# Patient Record
Sex: Female | Born: 1940 | Race: White | Hispanic: No | Marital: Married | Smoking: Former smoker
Health system: Southern US, Community
[De-identification: ages and names within clinical notes are randomized; demographics above are authoritative.]

## PROBLEM LIST (undated history)

## (undated) DIAGNOSIS — I639 Cerebral infarction, unspecified: Secondary | ICD-10-CM

## (undated) DIAGNOSIS — J449 Chronic obstructive pulmonary disease, unspecified: Secondary | ICD-10-CM

## (undated) DIAGNOSIS — D649 Anemia, unspecified: Secondary | ICD-10-CM

## (undated) DIAGNOSIS — J9610 Chronic respiratory failure, unspecified whether with hypoxia or hypercapnia: Secondary | ICD-10-CM

## (undated) DIAGNOSIS — I4891 Unspecified atrial fibrillation: Secondary | ICD-10-CM

## (undated) DIAGNOSIS — K922 Gastrointestinal hemorrhage, unspecified: Secondary | ICD-10-CM

## (undated) HISTORY — DX: Gastrointestinal hemorrhage, unspecified: K92.2

## (undated) HISTORY — DX: Unspecified atrial fibrillation: I48.91

## (undated) HISTORY — DX: Chronic respiratory failure, unspecified whether with hypoxia or hypercapnia: J96.10

## (undated) HISTORY — DX: Chronic obstructive pulmonary disease, unspecified: J44.9

## (undated) HISTORY — DX: Cerebral infarction, unspecified: I63.9

## (undated) HISTORY — DX: Anemia, unspecified: D64.9

---

## 2004-02-18 ENCOUNTER — Ambulatory Visit: Payer: Self-pay

## 2004-10-03 ENCOUNTER — Ambulatory Visit: Payer: Self-pay | Admitting: Internal Medicine

## 2005-02-07 ENCOUNTER — Emergency Department: Payer: Self-pay | Admitting: Emergency Medicine

## 2005-10-05 ENCOUNTER — Ambulatory Visit: Payer: Self-pay | Admitting: Internal Medicine

## 2006-03-07 ENCOUNTER — Ambulatory Visit: Payer: Self-pay | Admitting: Specialist

## 2006-05-10 ENCOUNTER — Ambulatory Visit: Payer: Self-pay | Admitting: Internal Medicine

## 2006-05-20 ENCOUNTER — Other Ambulatory Visit: Payer: Self-pay

## 2006-05-20 ENCOUNTER — Ambulatory Visit: Payer: Self-pay | Admitting: General Practice

## 2006-05-27 ENCOUNTER — Ambulatory Visit: Payer: Self-pay | Admitting: General Surgery

## 2006-12-19 ENCOUNTER — Ambulatory Visit: Payer: Self-pay | Admitting: Internal Medicine

## 2006-12-27 ENCOUNTER — Ambulatory Visit: Payer: Self-pay | Admitting: Gastroenterology

## 2007-01-31 ENCOUNTER — Ambulatory Visit: Payer: Self-pay | Admitting: Gastroenterology

## 2007-03-28 ENCOUNTER — Ambulatory Visit: Payer: Self-pay | Admitting: Gastroenterology

## 2007-06-12 ENCOUNTER — Ambulatory Visit: Payer: Self-pay | Admitting: Cardiology

## 2007-06-19 ENCOUNTER — Ambulatory Visit: Payer: Self-pay | Admitting: Gastroenterology

## 2007-07-29 ENCOUNTER — Encounter: Payer: Self-pay | Admitting: Specialist

## 2007-07-30 ENCOUNTER — Encounter: Payer: Self-pay | Admitting: Specialist

## 2007-08-29 ENCOUNTER — Encounter: Payer: Self-pay | Admitting: Specialist

## 2007-09-29 ENCOUNTER — Encounter: Payer: Self-pay | Admitting: Specialist

## 2007-10-29 ENCOUNTER — Encounter: Payer: Self-pay | Admitting: Specialist

## 2007-12-08 ENCOUNTER — Ambulatory Visit: Payer: Self-pay | Admitting: Internal Medicine

## 2008-12-08 ENCOUNTER — Ambulatory Visit: Payer: Self-pay | Admitting: Internal Medicine

## 2009-01-20 ENCOUNTER — Ambulatory Visit: Payer: Self-pay | Admitting: Gastroenterology

## 2009-01-24 ENCOUNTER — Inpatient Hospital Stay: Payer: Self-pay | Admitting: Cardiology

## 2009-07-15 ENCOUNTER — Inpatient Hospital Stay: Payer: Self-pay | Admitting: Internal Medicine

## 2009-12-22 ENCOUNTER — Ambulatory Visit: Payer: Self-pay | Admitting: Internal Medicine

## 2010-04-30 HISTORY — PX: PATENT FORAMEN OVALE CLOSURE: SHX2181

## 2010-05-06 ENCOUNTER — Emergency Department: Payer: Self-pay | Admitting: Emergency Medicine

## 2011-03-21 ENCOUNTER — Ambulatory Visit: Payer: Self-pay | Admitting: Internal Medicine

## 2011-09-29 ENCOUNTER — Inpatient Hospital Stay: Payer: Self-pay | Admitting: Internal Medicine

## 2011-09-29 LAB — URINALYSIS, COMPLETE
Blood: NEGATIVE
Leukocyte Esterase: NEGATIVE
Nitrite: NEGATIVE
Ph: 8 (ref 4.5–8.0)
Protein: NEGATIVE
Specific Gravity: 1.005 (ref 1.003–1.030)
WBC UR: <1 /HPF (ref 0–5)

## 2011-09-29 LAB — PROTIME-INR
INR: 1
Prothrombin Time: 13.2 secs (ref 11.5–14.7)

## 2011-09-29 LAB — CK TOTAL AND CKMB (NOT AT ARMC)
CK, Total: 34 U/L (ref 21–215)
CK-MB: 0.7 ng/mL (ref 0.5–3.6)

## 2011-09-29 LAB — COMPREHENSIVE METABOLIC PANEL
Albumin: 3.6 g/dL (ref 3.4–5.0)
Anion Gap: 6 — ABNORMAL LOW (ref 7–16)
Bilirubin,Total: 0.7 mg/dL (ref 0.2–1.0)
Chloride: 100 mmol/L (ref 98–107)
EGFR (African American): >60
EGFR (Non-African Amer.): >60
Glucose: 103 mg/dL — ABNORMAL HIGH (ref 65–99)
Potassium: 3.3 mmol/L — ABNORMAL LOW (ref 3.5–5.1)
SGOT(AST): 37 U/L (ref 15–37)
SGPT (ALT): 22 U/L
Sodium: 142 mmol/L (ref 136–145)
Total Protein: 8 g/dL (ref 6.4–8.2)

## 2011-09-29 LAB — CBC
HCT: 36.5 % (ref 35.0–47.0)
MCH: 32.7 pg (ref 26.0–34.0)
MCV: 98 fL (ref 80–100)
Platelet: 136 10*3/uL — ABNORMAL LOW (ref 150–440)
RBC: 3.73 10*6/uL — ABNORMAL LOW (ref 3.80–5.20)
RDW: 14.2 % (ref 11.5–14.5)
WBC: 4.1 10*3/uL (ref 3.6–11.0)

## 2011-09-29 LAB — APTT: Activated PTT: 26.1 secs (ref 23.6–35.9)

## 2011-09-29 LAB — TSH: Thyroid Stimulating Horm: 2.13 u[IU]/mL

## 2011-09-29 LAB — MAGNESIUM: Magnesium: 2 mg/dL

## 2011-09-30 LAB — CBC WITH DIFFERENTIAL/PLATELET
Basophil #: 0 10*3/uL (ref 0.0–0.1)
Basophil %: 1 %
HCT: 31 % — ABNORMAL LOW (ref 35.0–47.0)
HGB: 10.6 g/dL — ABNORMAL LOW (ref 12.0–16.0)
MCH: 33.5 pg (ref 26.0–34.0)
MCHC: 34.3 g/dL (ref 32.0–36.0)
MCV: 98 fL (ref 80–100)
Monocyte #: 0.5 x10 3/mm (ref 0.2–0.9)
Neutrophil %: 61.3 %
RBC: 3.17 10*6/uL — ABNORMAL LOW (ref 3.80–5.20)
RDW: 14.2 % (ref 11.5–14.5)

## 2011-09-30 LAB — CK TOTAL AND CKMB (NOT AT ARMC)
CK, Total: 27 U/L (ref 21–215)
CK-MB: 1 ng/mL (ref 0.5–3.6)

## 2011-09-30 LAB — LIPID PANEL
HDL Cholesterol: 50 mg/dL (ref 40–60)
Ldl Cholesterol, Calc: 44 mg/dL (ref 0–100)

## 2012-03-24 ENCOUNTER — Ambulatory Visit: Payer: Self-pay

## 2013-06-22 ENCOUNTER — Inpatient Hospital Stay: Payer: Self-pay

## 2013-06-22 LAB — URINALYSIS, COMPLETE
BILIRUBIN, UR: NEGATIVE
BLOOD: NEGATIVE
Bacteria: NONE SEEN
GLUCOSE, UR: NEGATIVE mg/dL (ref 0–75)
Ketone: NEGATIVE
LEUKOCYTE ESTERASE: NEGATIVE
Nitrite: NEGATIVE
PH: 8 (ref 4.5–8.0)
PROTEIN: NEGATIVE
SPECIFIC GRAVITY: 1.006 (ref 1.003–1.030)
Squamous Epithelial: 1
WBC UR: 1 /HPF (ref 0–5)

## 2013-06-22 LAB — TROPONIN I: Troponin-I: <0.02 ng/mL

## 2013-06-22 LAB — CBC
HCT: 35.7 % (ref 35.0–47.0)
HGB: 12 g/dL (ref 12.0–16.0)
MCH: 31.3 pg (ref 26.0–34.0)
MCHC: 33.7 g/dL (ref 32.0–36.0)
MCV: 93 fL (ref 80–100)
Platelet: 125 10*3/uL — ABNORMAL LOW (ref 150–440)
RBC: 3.84 10*6/uL (ref 3.80–5.20)
RDW: 13.8 % (ref 11.5–14.5)
WBC: 4.5 10*3/uL (ref 3.6–11.0)

## 2013-06-22 LAB — COMPREHENSIVE METABOLIC PANEL
ALK PHOS: 117 U/L
AST: 33 U/L (ref 15–37)
Albumin: 3.4 g/dL (ref 3.4–5.0)
Anion Gap: 1 — ABNORMAL LOW (ref 7–16)
BUN: 13 mg/dL (ref 7–18)
Bilirubin,Total: 0.5 mg/dL (ref 0.2–1.0)
Calcium, Total: 9 mg/dL (ref 8.5–10.1)
Chloride: 99 mmol/L (ref 98–107)
Co2: 38 mmol/L — ABNORMAL HIGH (ref 21–32)
Creatinine: 0.91 mg/dL (ref 0.60–1.30)
EGFR (African American): >60
EGFR (Non-African Amer.): >60
GLUCOSE: 93 mg/dL (ref 65–99)
OSMOLALITY: 275 (ref 275–301)
Potassium: 3.4 mmol/L — ABNORMAL LOW (ref 3.5–5.1)
SGPT (ALT): 21 U/L (ref 12–78)
SODIUM: 138 mmol/L (ref 136–145)
Total Protein: 7.5 g/dL (ref 6.4–8.2)

## 2013-06-23 LAB — BASIC METABOLIC PANEL
ANION GAP: 5 — AB (ref 7–16)
BUN: 15 mg/dL (ref 7–18)
CREATININE: 0.83 mg/dL (ref 0.60–1.30)
Calcium, Total: 8.6 mg/dL (ref 8.5–10.1)
Chloride: 103 mmol/L (ref 98–107)
Co2: 37 mmol/L — ABNORMAL HIGH (ref 21–32)
EGFR (Non-African Amer.): >60
GLUCOSE: 98 mg/dL (ref 65–99)
Osmolality: 290 (ref 275–301)
Potassium: 3.5 mmol/L (ref 3.5–5.1)
SODIUM: 145 mmol/L (ref 136–145)

## 2013-06-23 LAB — CBC WITH DIFFERENTIAL/PLATELET
BASOS ABS: 0 10*3/uL (ref 0.0–0.1)
BASOS PCT: 0.8 %
BASOS PCT: 1.1 %
Basophil #: 0.1 10*3/uL (ref 0.0–0.1)
EOS PCT: 5.2 %
Eosinophil #: 0.2 10*3/uL (ref 0.0–0.7)
Eosinophil #: 0.2 10*3/uL (ref 0.0–0.7)
Eosinophil %: 4.1 %
HCT: 29.7 % — ABNORMAL LOW (ref 35.0–47.0)
HCT: 35.1 % (ref 35.0–47.0)
HGB: 10.2 g/dL — ABNORMAL LOW (ref 12.0–16.0)
HGB: 11.4 g/dL — AB (ref 12.0–16.0)
LYMPHS ABS: 0.6 10*3/uL — AB (ref 1.0–3.6)
LYMPHS ABS: 0.6 10*3/uL — AB (ref 1.0–3.6)
LYMPHS PCT: 14.3 %
Lymphocyte %: 10.3 %
MCH: 31.9 pg (ref 26.0–34.0)
MCH: 30.8 pg (ref 26.0–34.0)
MCHC: 34.3 g/dL (ref 32.0–36.0)
MCHC: 32.6 g/dL (ref 32.0–36.0)
MCV: 93 fL (ref 80–100)
MCV: 95 fL (ref 80–100)
MONO ABS: 0.4 x10 3/mm (ref 0.2–0.9)
Monocyte #: 0.4 x10 3/mm (ref 0.2–0.9)
Monocyte %: 10.5 %
Monocyte %: 7.5 %
NEUTROS ABS: 2.9 10*3/uL (ref 1.4–6.5)
NEUTROS PCT: 69.2 %
NEUTROS PCT: 77 %
Neutrophil #: 4.2 10*3/uL (ref 1.4–6.5)
PLATELETS: 124 10*3/uL — AB (ref 150–440)
Platelet: 112 10*3/uL — ABNORMAL LOW (ref 150–440)
RBC: 3.19 10*6/uL — ABNORMAL LOW (ref 3.80–5.20)
RBC: 3.71 10*6/uL — ABNORMAL LOW (ref 3.80–5.20)
RDW: 14 % (ref 11.5–14.5)
RDW: 13.7 % (ref 11.5–14.5)
WBC: 4.1 10*3/uL (ref 3.6–11.0)
WBC: 5.5 10*3/uL (ref 3.6–11.0)

## 2013-06-23 LAB — MAGNESIUM: MAGNESIUM: 2 mg/dL

## 2013-06-23 LAB — PROTIME-INR
INR: 1.1
Prothrombin Time: 14.2 secs (ref 11.5–14.7)

## 2013-06-24 LAB — BASIC METABOLIC PANEL
ANION GAP: 4 — AB (ref 7–16)
BUN: 12 mg/dL (ref 7–18)
Calcium, Total: 8 mg/dL — ABNORMAL LOW (ref 8.5–10.1)
Chloride: 107 mmol/L (ref 98–107)
Co2: 31 mmol/L (ref 21–32)
Creatinine: 0.78 mg/dL (ref 0.60–1.30)
EGFR (African American): >60
Glucose: 86 mg/dL (ref 65–99)
Osmolality: 282 (ref 275–301)
POTASSIUM: 3.9 mmol/L (ref 3.5–5.1)
Sodium: 142 mmol/L (ref 136–145)

## 2013-06-24 LAB — HEMOGLOBIN: HGB: 10.4 g/dL — ABNORMAL LOW (ref 12.0–16.0)

## 2013-06-24 LAB — TSH: Thyroid Stimulating Horm: 1.69 u[IU]/mL

## 2013-06-25 LAB — BASIC METABOLIC PANEL
Anion Gap: 4 — ABNORMAL LOW (ref 7–16)
BUN: 14 mg/dL (ref 7–18)
Calcium, Total: 8.1 mg/dL — ABNORMAL LOW (ref 8.5–10.1)
Chloride: 107 mmol/L (ref 98–107)
Co2: 31 mmol/L (ref 21–32)
Creatinine: 0.81 mg/dL (ref 0.60–1.30)
EGFR (African American): >60
Glucose: 84 mg/dL (ref 65–99)
Osmolality: 283 (ref 275–301)
POTASSIUM: 4.1 mmol/L (ref 3.5–5.1)
Sodium: 142 mmol/L (ref 136–145)

## 2013-06-25 LAB — CBC WITH DIFFERENTIAL/PLATELET
Basophil #: 0 10*3/uL (ref 0.0–0.1)
Basophil %: 0.8 %
EOS ABS: 0.1 10*3/uL (ref 0.0–0.7)
EOS PCT: 3.6 %
HCT: 25.8 % — ABNORMAL LOW (ref 35.0–47.0)
HGB: 8.7 g/dL — ABNORMAL LOW (ref 12.0–16.0)
LYMPHS PCT: 14 %
Lymphocyte #: 0.5 10*3/uL — ABNORMAL LOW (ref 1.0–3.6)
MCH: 31.3 pg (ref 26.0–34.0)
MCHC: 33.8 g/dL (ref 32.0–36.0)
MCV: 93 fL (ref 80–100)
Monocyte #: 0.4 x10 3/mm (ref 0.2–0.9)
Monocyte %: 9.4 %
Neutrophil #: 2.8 10*3/uL (ref 1.4–6.5)
Neutrophil %: 72.2 %
Platelet: 99 10*3/uL — ABNORMAL LOW (ref 150–440)
RBC: 2.79 10*6/uL — ABNORMAL LOW (ref 3.80–5.20)
RDW: 13.5 % (ref 11.5–14.5)
WBC: 3.9 10*3/uL (ref 3.6–11.0)

## 2013-06-25 LAB — HEMOGLOBIN: HGB: 9.9 g/dL — AB (ref 12.0–16.0)

## 2013-06-26 LAB — CBC WITH DIFFERENTIAL/PLATELET
Basophil #: 0 10*3/uL (ref 0.0–0.1)
Basophil %: 1 %
EOS ABS: 0.2 10*3/uL (ref 0.0–0.7)
EOS PCT: 5.4 %
HCT: 24.5 % — AB (ref 35.0–47.0)
HGB: 8.3 g/dL — AB (ref 12.0–16.0)
LYMPHS ABS: 0.6 10*3/uL — AB (ref 1.0–3.6)
Lymphocyte %: 17.4 %
MCH: 31.6 pg (ref 26.0–34.0)
MCHC: 33.9 g/dL (ref 32.0–36.0)
MCV: 93 fL (ref 80–100)
Monocyte #: 0.3 x10 3/mm (ref 0.2–0.9)
Monocyte %: 10.4 %
NEUTROS ABS: 2.1 10*3/uL (ref 1.4–6.5)
NEUTROS PCT: 65.8 %
PLATELETS: 98 10*3/uL — AB (ref 150–440)
RBC: 2.63 10*6/uL — ABNORMAL LOW (ref 3.80–5.20)
RDW: 13.4 % (ref 11.5–14.5)
WBC: 3.2 10*3/uL — AB (ref 3.6–11.0)

## 2013-06-26 LAB — BASIC METABOLIC PANEL
ANION GAP: 4 — AB (ref 7–16)
BUN: 15 mg/dL (ref 7–18)
CHLORIDE: 110 mmol/L — AB (ref 98–107)
CREATININE: 0.69 mg/dL (ref 0.60–1.30)
Calcium, Total: 8.5 mg/dL (ref 8.5–10.1)
Co2: 29 mmol/L (ref 21–32)
EGFR (Non-African Amer.): >60
Glucose: 75 mg/dL (ref 65–99)
Osmolality: 285 (ref 275–301)
POTASSIUM: 4.3 mmol/L (ref 3.5–5.1)
Sodium: 143 mmol/L (ref 136–145)

## 2013-06-26 LAB — HEMOGLOBIN
HGB: 9.3 g/dL — ABNORMAL LOW (ref 12.0–16.0)
HGB: 9.8 g/dL — ABNORMAL LOW (ref 12.0–16.0)

## 2013-06-27 LAB — HEMOGLOBIN: HGB: 8.5 g/dL — ABNORMAL LOW (ref 12.0–16.0)

## 2013-06-28 LAB — HEMOGLOBIN: HGB: 9.9 g/dL — ABNORMAL LOW (ref 12.0–16.0)

## 2013-06-29 LAB — HEMOGLOBIN: HGB: 9 g/dL — ABNORMAL LOW (ref 12.0–16.0)

## 2014-04-02 ENCOUNTER — Inpatient Hospital Stay: Payer: Self-pay | Admitting: Internal Medicine

## 2014-04-02 LAB — COMPREHENSIVE METABOLIC PANEL
ALBUMIN: 2.7 g/dL — AB (ref 3.4–5.0)
Alkaline Phosphatase: 85 U/L
Anion Gap: 6 — ABNORMAL LOW (ref 7–16)
BILIRUBIN TOTAL: 0.3 mg/dL (ref 0.2–1.0)
BUN: 20 mg/dL — ABNORMAL HIGH (ref 7–18)
CREATININE: 0.98 mg/dL (ref 0.60–1.30)
Calcium, Total: 8.4 mg/dL — ABNORMAL LOW (ref 8.5–10.1)
Chloride: 107 mmol/L (ref 98–107)
Co2: 25 mmol/L (ref 21–32)
EGFR (Non-African Amer.): 59 — ABNORMAL LOW
GLUCOSE: 112 mg/dL — AB (ref 65–99)
Osmolality: 279 (ref 275–301)
Potassium: 5 mmol/L (ref 3.5–5.1)
SGOT(AST): 28 U/L (ref 15–37)
SGPT (ALT): 17 U/L
SODIUM: 138 mmol/L (ref 136–145)
TOTAL PROTEIN: 5.9 g/dL — AB (ref 6.4–8.2)

## 2014-04-02 LAB — CBC
HCT: 26.4 % — AB (ref 35.0–47.0)
HGB: 8.4 g/dL — ABNORMAL LOW (ref 12.0–16.0)
MCH: 29.2 pg (ref 26.0–34.0)
MCHC: 31.9 g/dL — ABNORMAL LOW (ref 32.0–36.0)
MCV: 92 fL (ref 80–100)
Platelet: 128 10*3/uL — ABNORMAL LOW (ref 150–440)
RBC: 2.88 10*6/uL — ABNORMAL LOW (ref 3.80–5.20)
RDW: 20.7 % — ABNORMAL HIGH (ref 11.5–14.5)
WBC: 5.9 10*3/uL (ref 3.6–11.0)

## 2014-04-03 LAB — CBC WITH DIFFERENTIAL/PLATELET
BASOS ABS: 0 10*3/uL (ref 0.0–0.1)
BASOS PCT: 1.2 %
Eosinophil #: 0.1 10*3/uL (ref 0.0–0.7)
Eosinophil %: 3.8 %
HCT: 19.8 % — AB (ref 35.0–47.0)
HGB: 6.3 g/dL — AB (ref 12.0–16.0)
Lymphocyte #: 0.6 10*3/uL — ABNORMAL LOW (ref 1.0–3.6)
Lymphocyte %: 14.9 %
MCH: 28.8 pg (ref 26.0–34.0)
MCHC: 32 g/dL (ref 32.0–36.0)
MCV: 90 fL (ref 80–100)
Monocyte #: 0.3 x10 3/mm (ref 0.2–0.9)
Monocyte %: 8.3 %
Neutrophil #: 2.7 10*3/uL (ref 1.4–6.5)
Neutrophil %: 71.8 %
PLATELETS: 94 10*3/uL — AB (ref 150–440)
RBC: 2.2 10*6/uL — ABNORMAL LOW (ref 3.80–5.20)
RDW: 20.3 % — ABNORMAL HIGH (ref 11.5–14.5)
WBC: 3.7 10*3/uL (ref 3.6–11.0)

## 2014-04-03 LAB — BASIC METABOLIC PANEL
ANION GAP: 9 (ref 7–16)
BUN: 15 mg/dL (ref 7–18)
CHLORIDE: 110 mmol/L — AB (ref 98–107)
CREATININE: 0.93 mg/dL (ref 0.60–1.30)
Calcium, Total: 7.8 mg/dL — ABNORMAL LOW (ref 8.5–10.1)
Co2: 28 mmol/L (ref 21–32)
EGFR (African American): >60
EGFR (Non-African Amer.): >60
GLUCOSE: 82 mg/dL (ref 65–99)
Osmolality: 292 (ref 275–301)
POTASSIUM: 4.6 mmol/L (ref 3.5–5.1)
Sodium: 147 mmol/L — ABNORMAL HIGH (ref 136–145)

## 2014-04-03 LAB — HEMOGLOBIN: HGB: 8.2 g/dL — ABNORMAL LOW (ref 12.0–16.0)

## 2014-04-04 LAB — CBC WITH DIFFERENTIAL/PLATELET
BASOS PCT: 0.9 %
Basophil #: 0 10*3/uL (ref 0.0–0.1)
Basophil #: 0 10*3/uL (ref 0.0–0.1)
Basophil %: 0.8 %
EOS PCT: 4.5 %
Eosinophil #: 0.2 10*3/uL (ref 0.0–0.7)
Eosinophil #: 0.2 10*3/uL (ref 0.0–0.7)
Eosinophil %: 4.9 %
HCT: 22.2 % — AB (ref 35.0–47.0)
HCT: 22.1 % — AB (ref 35.0–47.0)
HGB: 7.2 g/dL — ABNORMAL LOW (ref 12.0–16.0)
HGB: 7.1 g/dL — AB (ref 12.0–16.0)
Lymphocyte #: 0.5 10*3/uL — ABNORMAL LOW (ref 1.0–3.6)
Lymphocyte #: 0.5 10*3/uL — ABNORMAL LOW (ref 1.0–3.6)
Lymphocyte %: 14.5 %
Lymphocyte %: 14 %
MCH: 29.5 pg (ref 26.0–34.0)
MCH: 29.2 pg (ref 26.0–34.0)
MCHC: 32.5 g/dL (ref 32.0–36.0)
MCHC: 32.1 g/dL (ref 32.0–36.0)
MCV: 91 fL (ref 80–100)
MCV: 91 fL (ref 80–100)
MONOS PCT: 10 %
Monocyte #: 0.3 x10 3/mm (ref 0.2–0.9)
Monocyte #: 0.3 x10 3/mm (ref 0.2–0.9)
Monocyte %: 9.6 %
NEUTROS PCT: 70.1 %
NEUTROS PCT: 70.7 %
Neutrophil #: 2.5 10*3/uL (ref 1.4–6.5)
Neutrophil #: 2.4 10*3/uL (ref 1.4–6.5)
PLATELETS: 87 10*3/uL — AB (ref 150–440)
PLATELETS: 86 10*3/uL — AB (ref 150–440)
RBC: 2.45 10*6/uL — ABNORMAL LOW (ref 3.80–5.20)
RBC: 2.43 10*6/uL — AB (ref 3.80–5.20)
RDW: 18.8 % — ABNORMAL HIGH (ref 11.5–14.5)
RDW: 18.5 % — ABNORMAL HIGH (ref 11.5–14.5)
WBC: 3.5 10*3/uL — ABNORMAL LOW (ref 3.6–11.0)
WBC: 3.5 10*3/uL — ABNORMAL LOW (ref 3.6–11.0)

## 2014-04-04 LAB — BASIC METABOLIC PANEL
ANION GAP: 4 — AB (ref 7–16)
BUN: 9 mg/dL (ref 7–18)
CO2: 33 mmol/L — AB (ref 21–32)
Calcium, Total: 7.5 mg/dL — ABNORMAL LOW (ref 8.5–10.1)
Chloride: 107 mmol/L (ref 98–107)
Creatinine: 0.86 mg/dL (ref 0.60–1.30)
EGFR (African American): >60
EGFR (Non-African Amer.): >60
Glucose: 80 mg/dL (ref 65–99)
OSMOLALITY: 284 (ref 275–301)
Potassium: 3.4 mmol/L — ABNORMAL LOW (ref 3.5–5.1)
Sodium: 144 mmol/L (ref 136–145)

## 2014-04-04 LAB — HEMOGLOBIN: HGB: 9.5 g/dL — ABNORMAL LOW (ref 12.0–16.0)

## 2014-04-05 LAB — CBC WITH DIFFERENTIAL/PLATELET
BASOS ABS: 0 10*3/uL (ref 0.0–0.1)
Basophil %: 0.7 %
EOS PCT: 4.3 %
Eosinophil #: 0.2 10*3/uL (ref 0.0–0.7)
HCT: 26.2 % — ABNORMAL LOW (ref 35.0–47.0)
HGB: 8.6 g/dL — ABNORMAL LOW (ref 12.0–16.0)
LYMPHS ABS: 0.5 10*3/uL — AB (ref 1.0–3.6)
Lymphocyte %: 12.9 %
MCH: 29.8 pg (ref 26.0–34.0)
MCHC: 32.8 g/dL (ref 32.0–36.0)
MCV: 91 fL (ref 80–100)
MONO ABS: 0.4 x10 3/mm (ref 0.2–0.9)
MONOS PCT: 8.5 %
NEUTROS PCT: 73.6 %
Neutrophil #: 3.1 10*3/uL (ref 1.4–6.5)
Platelet: 92 10*3/uL — ABNORMAL LOW (ref 150–440)
RBC: 2.88 10*6/uL — ABNORMAL LOW (ref 3.80–5.20)
RDW: 18.3 % — AB (ref 11.5–14.5)
WBC: 4.2 10*3/uL (ref 3.6–11.0)

## 2014-04-05 LAB — HEMOGLOBIN: HGB: 8.6 g/dL — AB (ref 12.0–16.0)

## 2014-05-10 ENCOUNTER — Ambulatory Visit: Payer: Self-pay | Admitting: Gastroenterology

## 2014-06-01 ENCOUNTER — Inpatient Hospital Stay: Payer: Self-pay | Admitting: Internal Medicine

## 2014-06-01 LAB — BASIC METABOLIC PANEL
Anion Gap: 7 (ref 7–16)
BUN: 19 mg/dL — ABNORMAL HIGH (ref 7–18)
CHLORIDE: 103 mmol/L (ref 98–107)
CO2: 31 mmol/L (ref 21–32)
CREATININE: 1 mg/dL (ref 0.60–1.30)
Calcium, Total: 8.6 mg/dL (ref 8.5–10.1)
EGFR (African American): >60
EGFR (Non-African Amer.): 57 — ABNORMAL LOW
Glucose: 188 mg/dL — ABNORMAL HIGH (ref 65–99)
Osmolality: 288 (ref 275–301)
POTASSIUM: 4.2 mmol/L (ref 3.5–5.1)
Sodium: 141 mmol/L (ref 136–145)

## 2014-06-01 LAB — CBC
HCT: 37.7 % (ref 35.0–47.0)
HGB: 12.2 g/dL (ref 12.0–16.0)
MCH: 30.9 pg (ref 26.0–34.0)
MCHC: 32.3 g/dL (ref 32.0–36.0)
MCV: 96 fL (ref 80–100)
Platelet: 134 10*3/uL — ABNORMAL LOW (ref 150–440)
RBC: 3.94 10*6/uL (ref 3.80–5.20)
RDW: 14.7 % — AB (ref 11.5–14.5)
WBC: 11.3 10*3/uL — ABNORMAL HIGH (ref 3.6–11.0)

## 2014-06-01 LAB — MAGNESIUM: Magnesium: 2.4 mg/dL

## 2014-06-01 LAB — TROPONIN I: Troponin-I: <0.02 ng/mL

## 2014-06-02 LAB — TSH: Thyroid Stimulating Horm: 1.12 u[IU]/mL

## 2014-06-05 LAB — CBC WITH DIFFERENTIAL/PLATELET
BASOS ABS: 0 10*3/uL (ref 0.0–0.1)
Basophil %: 0.1 %
Eosinophil #: 0 10*3/uL (ref 0.0–0.7)
Eosinophil %: 0.1 %
HCT: 36.2 % (ref 35.0–47.0)
HGB: 12 g/dL (ref 12.0–16.0)
Lymphocyte #: 0.5 10*3/uL — ABNORMAL LOW (ref 1.0–3.6)
Lymphocyte %: 9.1 %
MCH: 31.6 pg (ref 26.0–34.0)
MCHC: 33.1 g/dL (ref 32.0–36.0)
MCV: 96 fL (ref 80–100)
MONO ABS: 0.5 x10 3/mm (ref 0.2–0.9)
Monocyte %: 9.5 %
NEUTROS PCT: 81.2 %
Neutrophil #: 4.4 10*3/uL (ref 1.4–6.5)
Platelet: 114 10*3/uL — ABNORMAL LOW (ref 150–440)
RBC: 3.79 10*6/uL — ABNORMAL LOW (ref 3.80–5.20)
RDW: 14.6 % — ABNORMAL HIGH (ref 11.5–14.5)
WBC: 5.4 10*3/uL (ref 3.6–11.0)

## 2014-06-05 LAB — BASIC METABOLIC PANEL
Anion Gap: 4 — ABNORMAL LOW (ref 7–16)
BUN: 33 mg/dL — AB (ref 7–18)
CO2: 36 mmol/L — AB (ref 21–32)
CREATININE: 1.12 mg/dL (ref 0.60–1.30)
Calcium, Total: 8.5 mg/dL (ref 8.5–10.1)
Chloride: 103 mmol/L (ref 98–107)
EGFR (Non-African Amer.): 50 — ABNORMAL LOW
GLUCOSE: 87 mg/dL (ref 65–99)
OSMOLALITY: 292 (ref 275–301)
Potassium: 4.5 mmol/L (ref 3.5–5.1)
Sodium: 143 mmol/L (ref 136–145)

## 2014-08-21 NOTE — H&P (Signed)
PATIENT NAME:  Alisha Collins, STANFORTH MR#:  [id number] DATE OF BIRTH:  1940-11-16  DATE OF ADMISSION:  04/02/2014  PRIMARY CARE PHYSICIAN:  Dr. Clydie Braun.    GASTROENTEROLOGIST:  Dr. Shelle Iron.   CHIEF COMPLAINT: Rectal bleed for 2 days.   HISTORY OF PRESENT ILLNESS:  Miss Heckstall is a pleasant 74 year old Caucasian female with previous history of CVA, COPD, oxygen dependent, CAD, history of atrial fibrillation, and history of diverticulosis along with angiodysplasia seen on colonoscopy in 2010, comes to the Emergency Room accompanied by family members with complaints of rectal bleed for 2 days. She has been having some runny stools mixed with blood and per husband she had four bowel movements with bright red blood per rectum yesterday, none today. The patient is hemodynamically stable. Hemoglobin is around 8.3. Her baseline hemoglobin is anywhere from 8-9.4. She is not having any abdominal pain. She is being admitted for further evaluation and management.   PAST MEDICAL HISTORY:  1. Hypertension.  2. Hypothyroidism.  3. History of ventricular tachycardia.  4. Mitral valve prolapse with moderate mitral regurgitation, patent foramen ovale status post repair in [facility] in 2012.  5. Cerebral hemorrhage in 2012.   PAST SURGICAL HISTORY: PFO repair in 2012.   ALLERGIES: No known drug allergies.   SOCIAL HISTORY: Remote history of smoking. Lives at home with husband. Denies alcohol or  illicit drug use.   FAMILY HISTORY: Father had multiple strokes, died after last stroke in the 79s. Mother lived up to 95.   MEDICATIONS:  1. Vitamin D3, 1000 international units daily.  2. Synthroid 100 mcg p.o. daily.  3. Symbicort 160/4.5 two puffs b.i.d.  4. Spiriva 18 mcg inhalation daily.  5. Sotalol 120 mg b.i.d.  6. Sodium chloride nasal spray 2 sprays 4 times a day as needed.  7. Simvastatin 20 mg daily at bedtime.  8. ProAir HFA 2 puffs 4 times a day as needed.  9. Potassium gluconate 1 tablet daily.     10. Protonix 40 mg b.i.d.  11. Multivitamin with iron p.o. daily.  12. Lasix 40 mg daily.  13. Fish oil 1000 mg p.o. b.i.d.  14. Citalopram 20 mg daily.   15. Alprazolam 0.5 mg every 8 hours as needed.   REVIEW OF SYSTEMS:  CONSTITUTIONAL: No fever, fatigue, weakness.  EYES: No blurred or double vision, glaucoma or cataracts.  EARS, NOSE, AND THROAT: No tinnitus, ear pain, hearing loss, or postnasal drip.  RESPIRATORY: Chronic shortness of breath, COPD, has oxygen. No cough or productive phlegm.  CARDIOVASCULAR: No chest pain, orthopnea, edema. Positive for chronic shortness of breath.  GASTROINTESTINAL: No nausea, vomiting. Positive for diarrhea with bloody stools.  GENITOURINARY: No dysuria, hematuria, or frequency.  ENDOCRINE: No polyuria, nocturia, or thyroid problems.  HEMATOLOGY: Positive for chronic anemia. No bleeding disorder.  SKIN: No acne or rash. The patient has chronic venous stasis changes in both lower extremities.  NEUROLOGIC: No CVA, TIA, dysarthria, or seizures.  PSYCHIATRIC:  No anxiety or depression.   All other systems reviewed and negative.   PHYSICAL EXAMINATION:  GENERAL: The patient is awake, alert, oriented x 3, not in acute distress.  VITAL SIGNS: Afebrile, pulse is 83, blood pressure is 114/59, saturation of 99% on 2 liters.  HEENT: Atraumatic, normocephalic. Pupils are PERRLA.  EOM intact. Oral mucosa is moist.  NECK: Supple. No JVD. No carotid bruits.  RESPIRATORY: Clear to auscultation bilaterally. Decreased breath sounds at the bases. No wheezing, respiratory distress, or labored breathing.  CARDIOVASCULAR: Both  heart sounds are normal. Rate, rhythm is irregularly irregular. No murmur heard. PMI not lateralized. Chest is nontender. Good pedal pulses. Good femoral pulses. The patient does have 2 + pitting edema with chronic venous stasis changes in both the lower extremities.  ABDOMEN: Soft, benign, nontender. No organomegaly.   RECTAL: Per Dr.  Carollee MassedKaminski the patient did have bright red blood per rectum.  PSYCHIATRIC: The patient is awake, alert, oriented x 3.   LABORATORY DATA:  H and H is 8.4 and 26.4, platelet count is 128,000. Glucose is 112. BUN is 20, creatinine is 0.98, sodium is 138, potassium is 5.0, chloride is 107, bicarbonate is 25. SGOT is 28, total protein is 5.9, albumin is 2.7.   EKG shows chronic atrial fibrillation which appears chronic.   ASSESSMENT: A 74 year old, Miss Diel, with multiple medical problems including cerebrovascular accident, chronic obstructive pulmonary disease, hyperlipidemia, chronic atrial fibrillation, comes in with:   1.  Lower gastrointestinal/rectal bleed, most likely appears diverticular-appearing. The patient presented with 2 day history of bright red blood per rectum which was without any pain in the abdomen. Her H and H remained stable at baseline, her hemoglobin baseline is around 8.0-9.3. We will monitor H and H closely. GI consultation has been placed. Transfuse as needed. The patient recently had colonoscopy in March of 2015 which showed hemorrhoids and diverticulosis. She also has known history of angiodysplasia. Await further recommendations per GI.  2.  Chronic atrial fibrillation, heart rate appears stable. The patient is not on any anticoagulation given her GI bleed in the past. Will continue sotalol.   3.  Chronic obstructive pulmonary disease on 2 liters nasal cannula oxygen, appears stable. Continue Spiriva and Symbicort.  4.  Hyperlipidemia, on simvastatin.  5.  Gastroesophageal reflux disease. Continue Protonix.  6.  Chronic lower extremity edema on Lasix.  7.  Deep vein thrombosis prophylaxis. TEDS and SCDs.    The above was discussed with the patient and the patient's family members in the Emergency Room. Further workup regarding the patient's clinical course.   TIME SPENT: 50 minutes.      ____________________________ Wylie HailSona A. Allena KatzPatel, MD sap:bu D: 04/02/2014 20:12:03  ET T: 04/02/2014 20:51:55 ET JOB#: [id number]  cc: Marcellius Montagna A. Allena KatzPatel, MD, <Dictator> Stann Mainlandavid P. Sampson GoonFitzgerald, MD Dow AdolphMatthew Rein, MD  Willow OraSONA A Jacinda Kanady MD ELECTRONICALLY SIGNED 04/23/2014 17:00

## 2014-08-21 NOTE — Consult Note (Signed)
EGD and colon done today for anemia and rectal bleeding.  to get past transverse colon due to restricted mobility and severe looping.diverticulosis.and ext hemorrhoidssource of bleeding was diverticular, but could have been angioectasia in R colon that was not reached today.  gastritis.  40 mg BID for 8 weeks, then 40 mg dailypylori serology tomorrow a.m.NSAIDSto restart anti-coagulation.   Electronic Signatures: Dow Adolphein, Cheetara Hoge (MD)  (Signed on 02-Mar-15 17:13)  Authored  Last Updated: 02-Mar-15 17:13 by Dow Adolphein, Shahrzad Koble (MD)

## 2014-08-21 NOTE — Consult Note (Signed)
Chief Complaint:  Subjective/Chief Complaint No BM today but still blood on wiping with toilet. Hgb stable.   VITAL SIGNS/ANCILLARY NOTES: **Vital Signs.:   01-Mar-15 09:37  Vital Signs Type Routine  Temperature Temperature (F) 97.9  Celsius 36.6  Temperature Source oral  Pulse Pulse 71  Respirations Respirations 20  Systolic BP Systolic BP 967  Diastolic BP (mmHg) Diastolic BP (mmHg) 69  Mean BP 81  Pulse Ox % Pulse Ox % 98  Pulse Ox Activity Level  At rest  Oxygen Delivery 3L   Brief Assessment:  GEN no acute distress   Cardiac Regular   Respiratory clear BS   Gastrointestinal Normal   Lab Results: Routine Chem:  27-Feb-15 05:22   Glucose, Serum 75  BUN 15  Creatinine (comp) 0.69  Sodium, Serum 143  Potassium, Serum 4.3  Chloride, Serum  110  CO2, Serum 29  Calcium (Total), Serum 8.5  Anion Gap  4  Osmolality (calc) 285  eGFR (African American) >60  eGFR (Non-African American) >60 (eGFR values <36m/min/1.73 m2 may be an indication of chronic kidney disease (CKD). Calculated eGFR is useful in patients with stable renal function. The eGFR calculation will not be reliable in acutely ill patients when serum creatinine is changing rapidly. It is not useful in  patients on dialysis. The eGFR calculation may not be applicable to patients at the low and high extremes of body sizes, pregnant women, and vegetarians.)  Routine Hem:  27-Feb-15 05:22   Hemoglobin (CBC)  8.3  WBC (CBC)  3.2  RBC (CBC)  2.63  Hematocrit (CBC)  24.5  Platelet Count (CBC)  98  MCV 93  MCH 31.6  MCHC 33.9  RDW 13.4  Neutrophil % 65.8  Lymphocyte % 17.4  Monocyte % 10.4  Eosinophil % 5.4  Basophil % 1.0  Neutrophil # 2.1  Lymphocyte #  0.6  Monocyte # 0.3  Eosinophil # 0.2  Basophil # 0.0 (Result(s) reported on 26 Jun 2013 at 06:31AM.)   Assessment/Plan:  Assessment/Plan:  Assessment Lower GI bleeding.   Plan For bowel prep today for colonoscopy tomorrow by Dr. RRayann Heman  thanks   Electronic Signatures: OVerdie Shire(MD)  (Signed 01-Mar-15 11:50)  Authored: Chief Complaint, VITAL SIGNS/ANCILLARY NOTES, Brief Assessment, Lab Results, Assessment/Plan   Last Updated: 01-Mar-15 11:50 by OVerdie Shire(MD)

## 2014-08-21 NOTE — Consult Note (Signed)
Chief Complaint:  Subjective/Chief Complaint Covering for Dr. Rayann Heman. Still having rectal bleeding. Some drop in hgb. On liquid diet.   VITAL SIGNS/ANCILLARY NOTES: **Vital Signs.:   28-Feb-15 07:52  Telemetry pattern Cardiac Rhythm Atrial fibrillation; pattern reported by Telemetry Clerk   Brief Assessment:  GEN no acute distress   Cardiac Regular   Respiratory clear BS   Gastrointestinal Normal   Lab Results: Routine Chem:  27-Feb-15 05:22   Glucose, Serum 75  BUN 15  Creatinine (comp) 0.69  Sodium, Serum 143  Potassium, Serum 4.3  Chloride, Serum  110  CO2, Serum 29  Calcium (Total), Serum 8.5  Anion Gap  4  Osmolality (calc) 285  eGFR (African American) >60  eGFR (Non-African American) >60 (eGFR values <34m/min/1.73 m2 may be an indication of chronic kidney disease (CKD). Calculated eGFR is useful in patients with stable renal function. The eGFR calculation will not be reliable in acutely ill patients when serum creatinine is changing rapidly. It is not useful in  patients on dialysis. The eGFR calculation may not be applicable to patients at the low and high extremes of body sizes, pregnant women, and vegetarians.)  Routine Hem:  27-Feb-15 05:22   Hemoglobin (CBC)  8.3  WBC (CBC)  3.2  RBC (CBC)  2.63  Hematocrit (CBC)  24.5  Platelet Count (CBC)  98  MCV 93  MCH 31.6  MCHC 33.9  RDW 13.4  Neutrophil % 65.8  Lymphocyte % 17.4  Monocyte % 10.4  Eosinophil % 5.4  Basophil % 1.0  Neutrophil # 2.1  Lymphocyte #  0.6  Monocyte # 0.3  Eosinophil # 0.2  Basophil # 0.0 (Result(s) reported on 26 Jun 2013 at 06:31AM.)   Assessment/Plan:  Assessment/Plan:  Assessment Lower GI bleeding.   Plan Moniter hgb. Transfuse as needed. Bowel prep tomorrow for colonoscopy on Monday by Dr. RRayann Heman   Electronic Signatures: OVerdie Shire(MD)  (Signed [id number])  Authored: Chief Complaint, VITAL SIGNS/ANCILLARY NOTES, Brief Assessment, Lab Results,  Assessment/Plan   Last Updated: 28-Feb-15 11:25 by OVerdie Shire(MD)

## 2014-08-21 NOTE — H&P (Signed)
PATIENT NAME:  Alisha Collins, Alisha Collins MR#:  [id number] DATE OF BIRTH:  1941/03/30  DATE OF ADMISSION:  06/22/2013  PRIMARY CARE PHYSICIAN: Dr. Clydie Braun.   REFERRING PHYSICIAN: Dr. Governor Rooks.   CHIEF COMPLAINT: Confusion.   HISTORY OF PRESENT ILLNESS: Alisha Collins is a 74 year old female with a history of multiple medical problems including previous history of CVA, COPD oxygen dependent, coronary artery disease. She is brought to the Emergency Department for altered mental status. The patient was noted to have confusion, difficulty remembering words. This occurred on Sunday. The patient was unable to remember this. The history is mainly obtained from the patient and Emergency Department physician's notes. The patient denies having any difficulty swallowing. Had some blurred vision. Denies having any headache. Workup in the Emergency Department with a CT head showed tiny hemorrhage in the anteromedial right thalamus and encephalomalacia of the left parietal-occipital lobe. The patient underwent MRI of the brain which showed small acute left mesial temporal and hippocampal infarct and a remote right thalamic hemorrhagic infarct.   PAST MEDICAL HISTORY:  1. Hypertension.  2. Hypothyroidism.  3. COPD, on 2 liters of oxygen.  4. Atrial fibrillation.  5. Previous history of GI bleed from supertherapeutic INR.  6. Angiodysplasia, diverticulosis by colonoscopy in 2010.  7. History of ventricular tachycardia.  8. Mitral valve prolapse with moderate mitral regurgitation and patent foramen ovale, status post repair at [facility] in 2012.  9. Cerebral hemorrhage in 2012.   PAST SURGICAL HISTORY: PFO repair in 2012.   ALLERGIES: No known drug allergies.   HOME MEDICATIONS:  1. Vitamin D3 1000 units daily.  2. Synthroid 100 mcg once a day.  3. Symbicort 160 mcg 2 puffs 2 times a day.  4. Spiriva 18 mcg once a day.  5. Sotalol 120 mg 2 times a day.  6.  nasal spray.  7. Simvastatin 20 mg once a day.  8.  ProAir 2 puffs 4 times a day.  9. Potassium gluconate once a day.  10. Multivitamin once a day.  11. Lasix 40 mg once a day.  12. Fish oil 1000 mg 2 times a day.  13. Citalopram 20 mg once a day.  14. Aspirin 81 mg once a day.  15. Alprazolam 0.5 mg every 8 hours as needed.   SOCIAL HISTORY: Remote history of smoking. Denies drinking alcohol or using illicit drugs. Married, lives with her husband.   FAMILY HISTORY: Father had multiple strokes, died after the last stroke in his 31s. Mother lived up to 95.   REVIEW OF SYSTEMS:  CONSTITUTIONAL: Denies any generalized weakness.   EYES: No change in vision.  ENT: No change in hearing.  RESPIRATORY: Has shortness of breath at baseline.   CARDIOVASCULAR: No chest pain, palpations.  GASTROINTESTINAL: No nausea, vomiting, abdominal pain.  GENITOURINARY: No dysuria or hematuria.  ENDOCRINE: Has history of hypothyroidism.  HEMATOLOGIC: No easy bruising or bleeding.  SKIN: No rash or lesions.  MUSCULOSKELETAL: No joint pains and aches.  NEUROLOGIC: Had memory issues. Otherwise, no weakness or numbness in any part of the body.   PHYSICAL EXAMINATION:  GENERAL: This is a well-built, well-nourished, age-appropriate female lying down in the bed, not in distress.  VITAL SIGNS: Temperature 98.3, pulse 64, blood pressure 120/79, respiratory rate of 20, oxygen saturation 100% on 2 liters of oxygen.  HEENT: Head normocephalic, atraumatic. There is no scleral icterus. Conjunctivae normal. Pupils equal and react to light. Extraocular movements are intact. Mucous membranes moist. No pharyngeal erythema.  NECK: Supple. No lymphadenopathy. No JVD. No carotid bruit.  CHEST: Has no focal tenderness.  LUNGS: Bilaterally clear to auscultation.  HEART: S1 and S2 regular. No murmurs are heard. Has 2 to 3+ pitting edema in the lower extremities. Has chronic venous stasis changes. Pulses 2+.  ABDOMEN: Bowel sounds present. Soft, nontender, nondistended. No  hepatosplenomegaly.  MUSCULOSKELETAL: Good range of motion in all of the extremities.  NEUROLOGIC: The patient is alert, oriented to place, person and time. Cranial nerves II through XII intact. Motor 5/5 in upper and lower extremities.   LABORATORIES: Potassium 3.4. The rest of all of the values are within normal limits. CT head as mentioned above. There was concern about tiny hemorrhage in the anteromedial right thalamus; however, MRI showed small acute left mesial temporal and hippocampal infarct.   ASSESSMENT AND PLAN: Ms. Alisha Collins is a 74 year old female who comes to the Emergency Department with difficulty finding words.  1. Cerebrovascular accident: Continue with aspirin. The patient will need to be started on Plavix. Will obtain lipid profile. The patient does not have currently any neurology deficits. The patient states that memory has improved.  2. Hypertension: Currently well controlled. Continue with the current regimen.  3. History of atrial fibrillation: Continue with sotalol.  4. History of chronic obstructive pulmonary disease: No current issues. Continue with breathing treatments as needed.  5. Keep the patient on deep vein thrombosis prophylaxis with Lovenox.   TIME SPENT: 50 minutes.   ____________________________ Susa GriffinsPadmaja Bettymae Yott, MD pv:gb D: 06/23/2013 01:09:51 ET T: 06/23/2013 04:48:50 ET JOB#: [id number]  cc: Susa GriffinsPadmaja Addalynn Kumari, MD, <Dictator> Stann Mainlandavid P. Sampson GoonFitzgerald, MD Clerance LavPADMAJA Eileene Kisling MD ELECTRONICALLY SIGNED 07/05/2013 22:40

## 2014-08-21 NOTE — Consult Note (Signed)
PATIENT NAME:  Alisha Collins, Alisha Collins MR#:  [id number] DATE OF BIRTH:  1940-06-23  DATE OF CONSULTATION:  06/23/2013  REFERRING PHYSICIAN:  Clydie Braun, MD CONSULTING PHYSICIAN:  Dow Adolph, MD  REASON FOR THE CONSULTATION:  Rectal bleeding.   HISTORY OF PRESENT ILLNESS:  Alisha Collins is a 74 year old female with a history of COPD on home O2, CVA, coronary disease, who presents to the hospital for altered mental status.  While in the hospital it came to light that the patient has been having trouble with intermittent rectal bleeding.  The patient is not very good historian.  Some of this history is also obtained from her son.  Per the son, she has had several episodes of rectal bleeding over the past couple weeks.  The patient thinks she only had one episode of rectal bleeding.   While in the hospital it was noted that the patient did have one episode of maroon stool today.  She was also noted to have a hemoglobin drop from 12 on presentation to 10, but a repeat today shows a hemoglobin in the mid-11's.   Of note, Alisha Collins had a colonoscopy back in 2010 for iron deficiency anemia.  At that time, she had several angiectasias that were treated.   PAST MEDICAL HISTORY: 1.  COPD, on home O2.  2.  Hypertension.  3.  Hypothyroid.  4.  A. Fib.  5.  Ventricular tachycardia.  6.  Mitral valve prolapse.  7.  Cerebral hemorrhage.   PAST SURGICAL HISTORY:  PFO repair 2012.   ALLERGIES:  No known drug allergies.   HOME MEDICATIONS: 1.  Vitamin D3 1000 units daily.  2.  Synthroid 100 mcg daily.  3.  Symbicort 160 mg 2 times a day.  4.  Spiriva 18 mg daily.  5.  Sotalol 120 mg twice daily.  6.  Simvastatin 20 mg daily.  7.  Lasix 40 mg daily.  8.  Citalopram 20 mg daily.  9.  Aspirin 81 mg daily.  10.  Xanax 0.5 mg every eight hours as needed.   SOCIAL HISTORY:  She and her son deny any history of alcohol or smoking.   FAMILY HISTORY:  She denies any family history of GI malignancy that she is  aware of.    REVIEW OF SYSTEMS:  A 10 system review was conducted.  It is negative except as stated in the HPI.   PHYSICAL EXAMINATION: VITAL SIGNS:  Her current temperature is 97.9, pulse is 83, respirations are 18, blood pressure 107/70, pulse ox is 98% on 3 liters.  GENERAL:  Alert and oriented x 4.  No acute distress.  Appears stated age. HEENT:  Normocephalic/atraumatic.  Extraocular movements are intact.  Anicteric.  Wearing a nasal cannula. NECK:  Soft, supple.  JVP appears normal.  No adenopathy. CHEST:  Decreased air entry bilaterally.   Clear to auscultation.  No wheeze or crackle.  Respirations unlabored. HEART:  Regular.  No murmur, rub, or gallop.  Normal S1 and S2. ABDOMEN:  Soft, nontender, nondistended.  Normal active bowel sounds in all four quadrants.  No organomegaly.  No masses EXTREMITIES:  No swelling, well perfused. SKIN:  No rash or lesion.  Skin color, texture, turgor normal. NEUROLOGICAL:  Grossly intact. PSYCHIATRIC:  Normal tone and affect. MUSCULOSKELETAL:  No joint swelling or erythema.   LABORATORY DATA:  There is a sodium of 145, potassium 3.5, BUN 15, creatinine 0.83.  Her liver enzymes are normal.  Troponin is normal.  White count  is 5.5, hemoglobin is 11.4, hematocrit is 35.  Her platelet count is 124.  Her INR is 1.1.   ASSESSMENT AND PLAN:  Lower gastrointestinal bleed:  It does seem that she had an episode of lower gastrointestinal bleed here while in the hospital.  Fortunately, her hemoglobin is quite stable and is actually stable at 11.4 currently.  I suspect the most likely etiology of her bleeding is either a diverticular bleed versus a bleeding angiectasia, given her history.   PLAN:  She would be high risk for a colonoscopy given her chronic obstructive pulmonary disease with oxygen dependence.  Therefore, unless a colonoscopy becomes likely high-yield, we will plan to try to avoid.  I would recommend continuing to monitor her hemoglobin and seeing  if she continues to bleed.  If she has a drop in her hemoglobin or continues to bleed I would recommend starting with a tagged red cell scan.  Based on the findings on this we can decide on going ahead with a colonoscopy versus angiography.     ____________________________ Dow AdolphMatthew Rein, MD mr:ea D: 06/23/2013 21:49:17 ET T: 06/23/2013 23:01:03 ET JOB#: [id number]  cc: Dow AdolphMatthew Rein, MD, <Dictator> Kathalene FramesMATTHEW G REIN MD ELECTRONICALLY SIGNED 06/30/2013 13:51

## 2014-08-21 NOTE — Discharge Summary (Signed)
PATIENT NAME:  Alisha Collins, [place] G MR#:  [id number] DATE OF BIRTH:  1940-10-26  DATE OF ADMISSION:  04/02/2014 DATE OF DISCHARGE:  04/05/2014    ADMISSION DIAGNOSIS: Lower gastrointestinal bleed.   DISCHARGE DIAGNOSES:  Lower gastrointestinal bleed.   CONSULTATIONS: Dow AdolphMatthew Rein, MD  LABORATORY DATA AT DISCHARGE: White blood cells 4.2, hemoglobin 8.6, hematocrit 27, platelets are 92,000.   HOSPITAL COURSE: This is a 74 year old female who presented on 04/02/2014 with rectal bleeding for 2 days. For further details, please refer to the H and P.  1.  Rectal bleed. Colonoscopy in March 2015 showed angioectasias and diverticulitis so GI thought that this was probably the same etiology for her rectal bleed this time. She did not have any evidence of rectal bleeding at this time except when wiping.  She is status post 2 units PRBCs.  If repeat hemoglobin is stable, the patient may be discharged home and plan for interventions as per the wishes of the patient  as she had no recurrence during hospitalization. ,  2.  Acute on chronic anemia secondary to lower gastrointestinal bleed, status post 2 units of packed red blood cells.    3.  Atrial fibrillation, chronic, rate controlled on sotalol.  We held her aspirin due to #1. 4.  History of cerebrovascular accident. No residual deficits.  5.  Hypothyroidism on Synthroid.    6.  Chronic obstructive pulmonary disease with chronic respiratory failure. The patient was stable from this point of view.   DISCHARGE MEDICATIONS:  1.  Synthroid 100 mcg daily. 2.  Sotalol 120 b.i.d.  3.  Symbicort 2 puffs b.i.d. 4.  Spiriva 18 mcg daily.   5.  Lasix 40 mg daily.  6.  Simvastatin 20 mg daily.  7.  Citalopram 20 mg daily.  8.  Pro-Air 2 puffs 4 times a day p.r.n.  9.  Pantoprazole 40 mg b.i.d.  10. Fluticasone 2 sprays daily.  11. K-Chlor 20 mEq b.i.d.   12. Ferrous sulfate 325 mg daily.  13. The patient will stop taking aspirin.   DISCHARGE DIET: Regular diet.    DISCHARGE OXYGEN: 2 liters nasal cannula.   DISCHARGE FOLLOWUP:  The patient will follow up with gastroenterology and Dr. Sampson GoonFitzgerald in 1 week clinic.    TIME SPENT: Approximately 40 minutes.      ____________________________ Janyth ContesSital P. Juliene PinaMody, MD spm:DT D: 04/05/2014 13:19:14 ET T: 04/05/2014 13:40:50 ET JOB#: [id number]  cc: Mindel Friscia P. Juliene PinaMody, MD, <Dictator> [facility] Stann Mainlandavid P. Sampson GoonFitzgerald, MD  Janyth ContesSITAL P Savaughn Karwowski MD ELECTRONICALLY SIGNED 04/05/2014 14:25

## 2014-08-21 NOTE — Consult Note (Signed)
Brief Consult Note: Diagnosis: LGIB.   Patient was seen by consultant.   Consult note dictated.   Recommend further assessment or treatment.   Comments: 1.) LGIB - intermittent.  Hgb stable.   Hx of colonic avm 2010.  Recs: - follow Hgb - if evidence of active bleeding, obtain tagged rbc scan - high risk for colonsocyp given pulmonary status, currenlty benefits do not outweigh risks.  Electronic Signatures: Dow Adolphein, Matthew (MD)  (Signed 24-Feb-15 17:38)  Authored: Brief Consult Note   Last Updated: 24-Feb-15 17:38 by Dow Adolphein, Matthew (MD)

## 2014-08-21 NOTE — Discharge Summary (Signed)
PATIENT NAME:  Alisha Collins, Alisha Collins MR#:  [id number] DATE OF BIRTH:  1941-02-25  DATE OF ADMISSION:  06/22/2013 DATE OF DISCHARGE:  06/30/2013  HISTORY OF PRESENT ILLNESS: [name] is a 74 year old white lady with a history of previous CVA, O2-dependent COPD and coronary artery disease with chronic atrial fib, who was brought to the Emergency Room for altered mental status. This patient was noted to have confusion and difficulty remembering words. She denied having any difficulty swallowing. She did have some blurred vision. CT scan in the Emergency Room showed a tiny hemorrhage in the anterior medial right thalamus with encephalomalacia of the left parieto-occipital lobe. Subsequent MRI of the brain showed a small acute left temporal and hippocampal infarct with a possible remote right thalamic hemorrhagic infarct.  The patient was therefore admitted.   PAST MEDICAL HISTORY:  Notable for hypertension, hypothyroidism, O2-dependent COPD, chronic atrial fibrillation, history of previous GI bleed, history of AVMs and diverticulosis, mitral valve prolapse and previous cerebral hemorrhage in 2012.   PAST SURGICAL HISTORY:  Included a patent foramen ovale repair [facility] in [facility].   ALLERGIES: No known drug allergies.   MEDICATIONS ON ADMISSION: Included vitamin D3 1000 units daily, Synthroid 100 mcg daily, Symbicort 2 puffs b.i.d., Spiriva 1 puff daily, sotalol 120 mg twice a day, simvastatin 20 mg daily, ProAir 2 puffs 4 times a day, potassium gluconate 1 tablet daily, multivitamin 1 tablet daily, Lasix 40 mg daily, omega-3 fish oil 1000 mg b.i.d., citalopram 20 mg daily, aspirin 81 mg daily and alprazolam 0.5 mg every 8 hours as needed.   ADMISSION PHYSICAL EXAMINATION:  VITAL SIGNS:  Revealed a temperature of 98.3, pulse 64, blood pressure 120/79 and a respiratory rate of 20. O2 sat was 100% on 2 liters.  CARDIOVASCULAR:  As noted by the admitting physician revealed chronic atrial fibrillation  but no  murmurs or gallops. There was 2 to 3+ pitting edema of the lower extremities. She did have palpable pulses.  NEUROLOGIC:  The patient was alert and oriented. Her neurological exam was basically unremarkable by the time she was seen by the admitting physician.   Admission CBC showed a hemoglobin of 12 with a hematocrit of 35.7. White count was 4500. Platelet count was 125,000. Admission comprehensive metabolic panel was notable for a potassium of 3.4. CO2 was 38, consistent with her history of COPD. Urinalysis was unremarkable. EKG showed atrial fibrillation with left axis deviation. There were nonspecific ST-T wave changes present. Unenhanced head CT in the Emergency Room showed a questionable tiny hemorrhage within the anterior medial right thalamus. Encephalomalacia of the left parieto-occipital lobe was also present. An MRI showed small acute left mesial temporal/hippocampal infarct. Doppler ultrasound of the carotids revealed bilateral plaque formation but less than 50% stenosis.   HOSPITAL COURSE: The patient was admitted to the regular medical floor where she was continued on her aspirin and Plavix. Her neurological signs and symptoms had, for the most part, totally cleared within 24 hours. Unfortunately, the patient was noted to have evidence of rectal bleeding. She was followed with serial hemoglobins and hematocrits. She was seen in consultation by GI. She underwent a GI nuclear screen for blood loss, which showed no active lower GI bleeding. She eventually underwent both upper and lower endoscopy, but no bleeding source was identified. During the patient's hospitalization, her aspirin and Plavix were held in view of her acute hemorrhage and also her GI bleed. The patient's hemoglobin eventually stabilized at 9. She was not transfused.  DISCHARGE DIAGNOSES: 1.  Small acute left mesial temporal/hippocampal infarct.  2.  Gastrointestinal  bleed of undetermined source.  3.  Anemia due to  gastrointestinal blood loss.   DISCHARGE MEDICATIONS: 1.  Synthroid 100 mcg daily.  2.  Omega-3 fish oral 1000 mg b.i.d.  3.  Sotalol 120 mg b.i.d.  4.  Symbicort 160/4.5 inhaler 2 puffs b.i.d.  5.  Spiriva 1 puff daily.  6.  Furosemide 40 mg daily.  7.  Simvastatin 20 mg at bedtime.  8.  Citalopram 20 mg daily.  9.  Alprazolam 0.5 mg every 8 hours as needed.  10.  ProAir 2 puffs 4 times a day as needed.  11.  Multivitamin with iron 1 tablet daily.  12.  Potassium  gluconate 595 mg 1 tablet daily.  13.  Vitamin D3, 1000 units daily.  14.  Protonix 40 mg daily.   The patient is to discontinue aspirin and Plavix.   The patient was discharged on a regular diet with activity as tolerated. She is to follow up with Dr. Sampson Goon in the office in 1 to 2 weeks.    ____________________________ Letta Pate Danne Harbor, MD jbw:dmm D: 07/14/2013 07:48:46 ET T: 07/14/2013 11:28:35 ET JOB#: [id number]  cc: Letta Pate. Danne Harbor, MD, <Dictator> Elmo Putt III MD ELECTRONICALLY SIGNED 07/19/2013 12:07

## 2014-08-22 NOTE — Consult Note (Signed)
74 YR OLD WHITE FEMALE WITH HISTORY OF ATRIAL FIBRILATION, ON SOTALOL, prior cerebral hemorrhage Nov 2012,  calcular cardiomyopathy, history of noninducible VT (per consult note by Wandra ScotAlex Parraschos last admission) presents with discrete episode of diaphoresis,  nausea, extremity numbness which occurred at rest today at home.  Hypokalemic with prlonged QT on EKG. Presyncope:  with prolonged QT , atrial fibrillation, remote history of noninducible VT.  Admitting for rule out MI, Telemetry monitoring, electrolyte replacement. Mg was normal. Cardiology consulted as patient had ECHO last week by Dr. Lady GaryFath.  Chronic respiratory failure:  secondary to COPD.  continue 02, Spiriva, Symbicort.  Hypokalemia: secondary to fuosemide use,  replcing orally. Hypothyroidism:  checking TSH. \CODE  Electronic Signatures: Duncan Dullullo, Rubel Heckard (MD)  (Signed on 01-Jun-13 19:12)  Authored  Last Updated: 01-Jun-13 19:12 by Duncan Dullullo, Carmellia Kreisler (MD)

## 2014-08-22 NOTE — Discharge Summary (Signed)
PATIENT NAME:  Alisha Collins, Alisha Collins MR#:  [id number] DATE OF BIRTH:  [birth year]  DATE OF ADMISSION:  09/29/2011 DATE OF DISCHARGE:  09/30/2011  DISCHARGE DIAGNOSES:  1. Dehydration with orthostatic dizziness.  2. History of atrial fibrillation, controlled with sotalol.  3. History of gastrointestinal bleed.  4. Hypothyroidism.  5. Chronic obstructive pulmonary disease, oxygen dependent.   DISCHARGE MEDICATIONS:  1. Synthroid 100 mcg daily.  2. 2 liters O2 at bedtime. 3. Sotalol 120 mg b.i.d.  4. Multivitamin daily.  5. Vitamin D3 1000 units daily.  6. Spiriva 18 mcg daily.  7. Loratadine 10 mg daily p.r.n.  8. Flonase two sprays daily.  9. Symbicort 160/4.5, 2 b.i.d.  10. Aspirin 81 mg daily.  11. Potassium 595 mg daily.  12. Simvastatin 20 mg at bedtime.  13. Furosemide 40 mg, 1 daily.   REASON FOR ADMISSION: 74 year old female who presents with diaphoresis and nausea. Please see history and physical for history of present illness, past medical history, physical exam.   HOSPITAL COURSE: Patient was admitted, ruled out for myocardial infarction. Became asymptomatic. No evidence for recurrent GI bleed nor evidence for arrhythmia. She was back to baseline. She will stay off the Claritin, drop her furosemide to 1 daily. Drink more fluids.      FOLLOW UP: Follow up with Dr. Candelaria Stagershaplin as scheduled.   ____________________________ Danella PentonMark F. Seven Marengo, MD mfm:cms D: 09/30/2011 09:12:16 ET T: 09/30/2011 11:54:41 ET JOB#: [id number]  cc: Danella PentonMark F. Khadar Monger, MD, <Dictator> Danella PentonMARK F Breton Berns MD ELECTRONICALLY SIGNED 10/02/2011 8:06

## 2014-08-22 NOTE — H&P (Signed)
PATIENT NAME:  Alisha Collins, Alisha Collins MR#:  [id number] DATE OF BIRTH:  02-08-1941  DATE OF ADMISSION:  09/29/2011  CHIEF COMPLAINT: Presyncopal episode.   HISTORY OF PRESENT ILLNESS: Ms. Nienhuis is a 74 year old white female with a history of valvular cardiomyopathy, history of atrial fibrillation with prior embolic stroke, history of noninducible VT (ventricular tachycardia) who was in her usual current state of health at home when this morning she had an episode of diaphoresis and nausea accompanied by a feeling of impending syncope with hands, feet, and lips going numb which occurred while she was at rest sitting in a golf cart. She had just walked to the golf cart with her husband to go check out a tree that had fallen on their property and while in the golf cart suddenly felt that she was going to pass out. The episode lasted about 15 minutes. She returned home and husband brought her to the ER. His attempts to lower her head caused her to feel more dizzy and did not help the feeling. The patient also had an episode about two weeks ago where she suddenly felt very short of breath and could not catch her breath and this also resolved spontaneously. Patient notes that she was started on loratadine and an inhaled antihistamine last week by her PCP for some upper respiratory symptoms. Her husband made her stop this apparently yesterday when she started having episodes of palpitations. In the ED she was noted to be in atrial fibrillation with a prolonged QT interval. Orthostatics were normal.   PRIMARY CARE PHYSICIAN: Dr. Candelaria Stagers  CARDIOLOGIST: Dr. Lady Gary. She does note that she had an echocardiogram done last week at [facility] by Dr. Lady Gary.   PAST MEDICAL HISTORY:  1. Chronic obstructive pulmonary disease secondary to tobacco abuse, on supplemental O2 x4 years.  2. Atrial fibrillation controlled with sotalol.  3. GI bleed secondary to prolonged INR/acquired coagulopathy in March 2011.  4. History of  angiodysplasia and diverticulosis by 2010 colonoscopy.  5. History of noninducible ventricular tachycardia 1991, [facility] evaluation, records unavailable.  6. History of mitral valve prolapse with moderate regurgitation and patent foramen ovale status post repair of PFO at [facility] in November 2012.  7. Hypothyroidism.  8. History of cerebral hemorrhage January 2012 hospitalized at [facility] with transient loss of peripheral vision, now resolved.   MEDICATIONS:  1. Symbicort 160/4.5, 2 puffs twice daily.  2. Spiriva 18 mcg inhaled daily.  3. Synthroid 100 mcg daily. 4. Oxygen 2 liters per minute with sleep and with exertion.  5. Vitamin D3 1000 units daily.  6. Sotalol 120 mg twice daily.  7. Simvastatin 20 mg daily. 8. Potassium gluconate 595 mg daily.  9. 81 mg of aspirin daily.  10. Furosemide 40 mg daily, increased to 80 mg p.r.n. weight gain. 11. Loratadine 10 mg daily, started last week and recently stopped.   PAST SURGICAL HISTORY: PFO (patent foramen ovale) closure in November 2012 at [facility].   ALLERGIES: No known drug allergies.   LAST HOSPITALIZATION: November 2012 for the surgery at [facility]. Last [facility] admission was March 2011 for GI bleed secondary to supratherapeutic INR.   FAMILY HISTORY: Father had multiple strokes and died after his last stroke in his 40s. Mother lived until age 60.   SOCIAL HISTORY: She is married. She is an [employer] investment [employer] user. She quit 25 years ago. She is a nondrinker.   REVIEW OF SYSTEMS: Patient has had improved energy and exercise tolerance since having her heart surgery in November.  She denies any fever, fatigue, weakness, pain, or weight changes. She denies blurred or double vision. She denies any history of tinnitus, ear pain, or hearing loss. She has had some postnasal drip recently per history of present illness. She denies any cough, wheezing, hemoptysis, or dyspnea. She did have a sudden onset of dyspnea episode about two weeks ago, which resolved spontaneously. She  has chronic three pillow orthopnea, and 1 to 2+ lower extremity edema which is managed with daily furosemide. She has had palpitations per history of present illness. She has had presyncopal episodes per history of present illness. She has a history of hypertension and varicose veins as well. No recent nausea, vomiting, diarrhea, or abdominal pain. She has had a recent change in bowel habits with increased constipation over the last several weeks. She denies dysuria, hematuria and incontinence. She has no history of polyuria, nocturia. She does have thyroid problems and is managed with Synthroid. She denies anemia, easy bruising or bleeding. She has no chronic neck, back, shoulder, knee or hip pain. She denies numbness, weakness, dysarthria, epilepsy, tremor and vertigo. She has had a prior CVA, but all deficits have been resolved. She has no history of anxiety, insomnia or depression.   PHYSICAL EXAMINATION:  GENERAL: This is an elderly female who looks younger than her stated age and is in no apparent distress.   VITAL SIGNS: Orthostatics were negative, in fact blood pressure 106/56, pulse 79 supine and standing 112/53 with a pulse of 69. Room air saturations 90%, pulse 71 and irregular, temperature 96.   HEENT: Pupils are equal, round, reactive to light. Extraocular movements are intact. Sclerae are nonicteric. Oropharynx is benign.   NECK: Supple without lymphadenopathy, JVD, thyromegaly, or carotid bruits.   LUNGS: Notable for dullness bilaterally at the bases with no rhonchi or wheezing.   CARDIOVASCULAR: Irregularly irregular with a diastolic murmur. She has 1 to 2+ pitting edema in the lower extremities. Pedal pulses are palpable. Chest wall is nontender.   ABDOMEN: Soft, nontender, nondistended with good bowel sounds and no evidence of hepatosplenomegaly.   MUSCULOSKELETAL: She is moving all extremities well and has normal strength in all four.   SKIN: Skin is warm and dry without rashes  or lesions. She has no cervical, axillary, inguinal, or supraclavicular lymphadenopathy.   NEUROLOGICAL: Grossly nonfocal with specifically her cranial nerves are intact. She has no aphasia or dysarthria. She is alert and oriented to person, place, and time and cooperative.   LABORATORY, DIAGNOSTIC AND RADIOLOGICAL DATA: Sodium 142, potassium 3.3, chloride 100, bicarbonate 36, BUN 15, creatinine 0.61, glucose 103, hemoglobin 12.2, white count 4.1, platelets 136, INR 1.0. Liver function tests are normal. CK 41. MB 0.7, troponin I is less than 0.02. EKG shows atrial fibrillation with left axis deviation and QT prolongation to 503 ms. Head CT shows previously noted occipital hemorrhage that is resolved with an area of encephalomalacia noted now. There are no acute changes. Urinalysis is normal. Magnesium level is pending.   ASSESSMENT AND PLAN:  1. Presyncopal episode. Given her history of atrial fibrillation and ventricular tachycardia accompanied by QT prolongation and hypokalemia on her EKG this patient is at high risk for fatal arrhythmia. I will admit her to a monitored telemetry bed, replace her electrolytes and continue 24 hour observation with telemetry to monitor for any signs of recurrent V. tach.  2. Chronic respiratory failure as evidenced by her bicarbonate of 36 and history of chronic obstructive pulmonary disease with O2 dependence. Continue inhalers, supplemental  O2. Chest x-ray ordered as this has not been done yet.  3. Thrombocytopenia. Apparently this is new onset. Dr. Candelaria Stagers apparently noticed it last week when he did some blood work. No work-up has been done. Will check LDH and platelet smear. She is not on anticoagulation now except for baby aspirin.   ESTIMATED TIME OF CARE: 40 minutes.   ____________________________ Duncan Dull, MD tt:cms D: 09/29/2011 17:23:50 ET T: 09/30/2011 08:14:14 ET JOB#: [id number]  cc: Duncan Dull, MD, <Dictator> Duncan Dull MD ELECTRONICALLY  SIGNED 11/05/2011 13:17

## 2014-08-25 NOTE — Consult Note (Signed)
PATIENT NAME:  Alisha Collins, Alisha Collins MR#:  [id number] DATE OF BIRTH:  [birth year]  DATE OF CONSULTATION:  04/03/2014  REFERRING PHYSICIAN:  Enid Baasadhika Kalisetti, MD  CONSULTING PHYSICIAN:  Dow AdolphMatthew Rein, MD  REASON FOR CONSULTATION: Lower GI bleed.  HISTORY OF PRESENT ILLNESS: Ms. Alisha Collins is a 74 year old female with a past medical history notable for a CVA, COPD on home oxygen, coronary artery disease, atrial fibrillation who is presenting for evaluation of rectal bleeding. Per Ms. Alisha Collins and her husband, she has had multiple bright red blood bowel movements over the past 48 hours. However, she has not passed any blood since last night. She has a drop in her hemoglobin to 6.5 and did receive 1 unit of packed red blood cells. Of note, Ms. Alisha Collins was also in hospital for a lower GI bleed back in March 2015. At that time she had a colonoscopy that was incomplete due to severe looping. On that test she did have hemorrhoids and also diverticulosis and internal hemorrhoids. She also had an upper endoscopy during that hospitalization that was notable for mild gastritis. Of note, in the past she has had colonic AVMs that have been treated.  PAST MEDICAL HISTORY: 1.  COPD on home oxygen. 2.  CVA. 3.  Coronary artery disease. 4.  Atrial fibrillation. 5.  Hypertension. 6.  Hypothyroidism. 7.  History of ventricular tachycardia. 8.  History of cerebral hemorrhage in 2012.  ALLERGIES: NKDA.  SOCIAL HISTORY: Remote smoking history. No alcohol. She lives with her husband.  FAMILY HISTORY: No family history of GI malignancy.  HOME MEDICATIONS: Vitamin D3, Synthroid, Symbicort, Spiriva, sotalol, simvastatin, ProAir, potassium gluconate, Protonix, multivitamin, Lasix, fish oil, citalopram, alprazolam.  REVIEW OF SYSTEMS: A 10-system review was conducted. It is negative except as stated in the HPI.  PHYSICAL EXAMINATION: VITAL SIGNS: Temperature is 97.8, pulse is 63, respirations are 20, blood pressure is 92/60, pulse  oximetry is 99% on 4 L. GENERAL: Alert and oriented x 4.  No acute distress. Appears stated age. HEENT: Positive for nasal cannula.  NECK: Soft, supple. JVP appears normal. No adenopathy. CHEST: Coarse breath sounds bilaterally.  HEART: Regular. No murmur, rub, or gallop.  Normal S1 and S2. ABDOMEN: Soft, nontender, nondistended.  Normal active bowel sounds in all 4 quadrants.  No organomegaly. No masses. Obese abdomen.  EXTREMITIES: No swelling, well perfused. SKIN: No rash or lesion. Skin color, texture, turgor normal. NEUROLOGICAL: Grossly intact. PSYCHIATRIC: Normal tone and affect. MUSCULOSKELETAL: No joint swelling or erythema.   LABORATORY DATA: Sodium is 147, potassium 4.6, BUN 15, creatinine 0.93. Albumin is 2.7; otherwise, liver enzymes are normal. White count is 3.7; hemoglobin 8.2, which is after a transfusion; platelets are 94,000.   ASSESSMENT AND PLAN: Lower gastrointestinal bleeding: This is a recurrence of a similar presentation in March, at which time the colonoscopy was incomplete up to the transverse colon due to severe looping. She did have a significant drop in her hemoglobin this presentation to 6.5. Fortunately she has not had any bleeding since yesterday, and it is very likely that the bleeding has since resolved. The most likely source of the bleeding is diverticular. However, we were not able to see her right colon and she also does have a history of angiectasias.  I did have a long discussion with the patient and her husband about the options at this point. We discussed the 4 main options. They are: 1.  Try to repeat the colonoscopy here. 2.  [facility]. 3.  CT colonography. 4.  To not do any of these above options at this time and continue to monitor.  At this time, per discussions, they would like to at this time hold off on all additional diagnostic testing. They would like to follow up in the GI clinic to see how she was doing  and again have this discussion at that time. If she were to have further bleeding, then we would likely need to choose one of the first 3 options at that time.  RECOMMENDATIONS: 1.  Will not pursue colonoscopy or CT colonography at this time per long discussion, see above. 2.  Continue to monitor hemoglobin until stable. 3.  Likely safe for discharge tomorrow if no further bleeding and her hemoglobin is stable. 4.  Follow up in GI clinic.   ____________________________ Dow Adolph, MD mr:ST D: 04/03/2014 16:49:43 ET T: 04/03/2014 22:14:38 ET JOB#: [id number]  cc: Dow Adolph, MD, <Dictator> Kathalene Frames MD ELECTRONICALLY SIGNED 05/03/2014 14:16

## 2014-08-29 NOTE — Consult Note (Signed)
PATIENT NAME:  Alisha Collins, Alisha Collins MR#:  [id number] DATE OF BIRTH:  13-Apr-1941  DATE OF CONSULTATION:  06/02/2014  REFERRING PHYSICIAN:   CONSULTING PHYSICIAN:  Yevonne PaxSaadat A. Khan, MD  REASON FOR CONSULTATION: Acute respiratory failure with chronic obstructive pulmonary disease.   HISTORY OF PRESENT ILLNESS: A 74 year old female who has multiple medical problems, including chronic atrial fibrillation, COPD on oxygen therapy at home, former smoker. She says that [facility], came into the hospital with increasing cough, had been having some congestion and productive of yellowish sputum. She had no hemoptysis, no chest pain, no palpitations. She had been having wheezing also. She denies any weight loss. She says that she is compliant with oxygen at home.   PAST MEDICAL HISTORY: Significant for COPD, GI bleed, atrial fibrillation, history of CVA, chronic anemia, and also chronic respiratory failure.   SOCIAL HISTORY: She currently does not smoke or drink.   PAST SURGICAL HISTORY: Significant for a PFO appearing in 2012.   FAMILY HISTORY: Positive for cardiovascular disease.   ALLERGIES: Negative.   HOME MEDICATIONS: Reviewed on the electronic medical record.   REVIEW OF SYSTEMS: A complete 12-point review of systems was performed, was unremarkable other than what is noted above in the HPI.   PHYSICAL EXAMINATION: VITAL SIGNS: Temperature 97.4, pulse 97, respiratory rate 20, blood pressure 116/78, saturations were 100%. She was on 4 liters nasal cannula.  NECK: Appeared to be supple. There is no JVD. No adenopathy. No thyromegaly.  CHEST: No rales, but positive rhonchi overall, somewhat diminished though.  CARDIOVASCULAR: S1, S2 is normal. Irregular rhythm. No gallop or rub.  ABDOMEN: Soft and nontender.  NEUROLOGIC: She was awake and alert, moving all 4 extremities and had no focal deficits.  SKIN: Without any acute rashes.  MUSCULOSKELETAL: Without active  synovitis  IMAGING STUDIES: : Chest x-ray: The last chest x-ray showed severe emphysematous changes plus some evidence of pulmonary edema.   LABORATORY DATA: The patient had lab work. White count 11.3, hemoglobin 12.2, hematocrit 37.7. The patient's chemistry that was done showed sodium 141, potassium 4.2, BUN 19, creatinine 1, glucose was 188.   IMPRESSION: 1. Acute on chronic respiratory failure.  2. Chronic obstructive pulmonary disease with exacerbation.  3. Probable pulmonary edema.   PLAN: She is admitted right now to the hospital. Continue with bronchodilators. Continue with antibiotics as you are. She is already on Symbicort, which will be continued. She has gotten Lasix. Hopefully, this will help with diuresis and I would continue with Solu-Medrol 60 mg q. 12 as you are. The patient appears to be gradually improving. Would be happy to see her after discharge in the office, as she does desire.   Thank you for consulting me in the care of this patient. I will make further recommendations as deemed necessary.    ____________________________ Yevonne PaxSaadat A. Khan, MD sak:mw D: 06/02/2014 18:25:22 ET T: 06/02/2014 19:25:41 ET JOB#: [id number]  cc: Yevonne PaxSaadat A. Khan, MD, <Dictator> Yevonne PaxSAADAT A KHAN MD ELECTRONICALLY SIGNED 06/20/2014 12:34

## 2014-08-29 NOTE — Discharge Summary (Signed)
PATIENT NAME:  Alisha Collins, Alisha Collins MR#:  [id number] DATE OF BIRTH:  [birth year]  DATE OF ADMISSION:  06/01/2014 DATE OF DISCHARGE:  06/07/2014  DISCHARGE MEDICATIONS:   1. Sotalol 120 mg twice daily.   2. Symbicort 160/4.5 two puffs b.i.d.  3. Spiriva 18 mcg inhalation daily.  4. Furosemide 40 mg p.o. daily.  5. Simvastatin 20 mg p.o. daily.  6. Fluticasone nasal  spray  50 mcg 2 sprays in each nostril daily.  7. KCl 20 mEq p.o. b.i.d.   8. Celexa 20 mg p.o. daily.  9. Synthroid 100 mcg p.o. daily.  10. MiraLax 17 grams daily as needed for constipation.  11. Albuterol nebulizer every 4 hours as needed.  12. Prednisone 20 mg p.o. 3 tablets daily for 2 days, 2 tablets daily for 2 days.   13. Augmentin 875/125 mg p.o. b.i.d. for 10 days.   Diet is low-sodium, low-fat diet. Oxygen 4 liters by nasal cannula.   CONSULTATIONS: Pulmonary and critical care consult with Dr. Freda MunroSaadat Collins and Dr. Belia Collins.   HOSPITAL COURSE: The patient is a 74 year old female patient who came in on February 2 because of shortness of breath. The patient has history of COPD on oxygen 4 liters at home and has a history of atrial fibrillation, recent GI bleed, came in because of shortness of breath and productive cough. The patient admitted for acute on chronic respiratory failure with COPD exacerbation. Chest x-ray showed severe COPD but no pneumonia. She was started on IV Solu-Medrol along with nebulizers and Spiriva. She took a long time to get better and continued to have wheezing, cough, phlegm, and shortness of breath on minimal exertion. Seen by pulmonary and the patient is continued on her Symbicort, Spiriva, and also steroids and Xopenex.  CT angiogram of chest was done on February 4 which showed pulmonary hypertension and mildly enlarged pulmonary arteries. The patient has elevated right heart pressures. She has no PE on the CT chest.  The patient continued to have some wheezing, so we increased the steroids, continued her on  nebulizers. I advised [facility] and see if she needs medication for pulmonary hypertension. The patient chose to follow up with Dr. Dema Collins and we gave her Dr. Courtney Collins's phone number to get appointment.    DISCHARGE VITAL SIGNS: Temperature 98.3, heart rate 75, blood pressure 111/80, saturations 100% on 4 liters   PHYSICAL EXAMINATION AT THE TIME OF DISCHARGE: CARDIOVASCULAR: s1 s2   regular.  LUNGS: Clear to auscultation except decreased breath sounds at the bases.  ABDOMEN: Soft, nontender, nondistended. Bowel sounds present.   LABORATORY DATA: On February 6, sodium 143, potassium 4.5, chloride 103, bicarbonate 36, BUN 33, creatinine 1.12, glucose 85. WBC on \ admission 11.6  hemoglobin is 12, hematocrit 36.2, platelets 114,000.    The patient's primary doctor is Dr. Sampson Collins and patient discharged home with home physical therapy and RN.   TIME SPENT:  More than 30 minutes.     ____________________________ Alisha Collins Alisha Treese, MD sk:bu D: 06/09/2014 13:26:18 ET T: 06/09/2014 20:24:05 ET JOB#: [id number]  cc: Alisha Collins Desa Rech, MD, <Dictator> Alisha Collins Ewa Hipp MD ELECTRONICALLY SIGNED 06/10/2014 15:49

## 2014-08-29 NOTE — H&P (Signed)
PATIENT NAME:  Alisha Collins, Alisha Collins MR#:  [id number] DATE OF BIRTH:  12/28/1940  DATE OF ADMISSION:  06/01/2014  PRIMARY CARE PHYSICIAN: Dr. Sampson Goon.  CHIEF COMPLAINT: Shortness of breath and cough since yesterday.   HISTORY OF PRESENT ILLNESS: A 74 year old Caucasian female with a history of COPD, atrial fibrillation, recent GI  bleeding, presented to the ED with the above chief complaint. The patient is alert, awake, oriented, in no acute distress. The patient said she started to have shortness of breath with cough and sputum since yesterday. The patient denies any fever or chills. No headache or dizziness. Denies any orthopnea or nocturnal dyspnea, but has chronic leg edema. The patient denies any other symptoms.   PAST MEDICAL HISTORY: Recent GI bleeding last December, COPD, atrial fibrillation, history of CVA, anemia, chronic respiratory failure on 4 liters oxygen.  SOCIAL HISTORY: A remote smoker. Denies any alcohol drinking or illicit drugs.   PAST SURGICAL HISTORY: PFO repair in 2012.    FAMILY HISTORY: Father had multiple strokes and died of stroke. Mother lived up to 95.   ALLERGIES: None.  HOME MEDICATIONS: Synthroid 100 mcg 1 tablet once a day, Symbicort 160 mcg/4.5 mcg 2 puffs inhaled twice a day, Spiriva 18 mcg inhaled 1 capsule once a day, sotalol 120 mg p.o. twice a day Zocor 20 mg p.o. once a day at bedtime, ProAir HFA CFC 90 mcg 2 puffs inhaled 4 times a day p.r.n. for shortness of breath, pantoprazole 40 mg p.o. b.i.d., Klor-Con 20 mEq p.o. b.i.d., Lasix 40 mg p.o. daily, fluticasone nasal 50 mcg 2 sprays in each nostril once a day, ferrous sulfate 325 mg p.o. daily, citalopram 20 mg p.o. at bedtime.   REVIEW OF SYSTEMS:  CONSTITUTIONAL: The patient denies any fever or chills. No headache or dizziness or weakness.  EYES: No double vision or blurred vision.  EARS, NOSE, AND THROAT: No postnasal drip, slurred speech, or dysphagia.  CARDIOVASCULAR: No chest pain, palpitation,  orthopnea, or nocturnal dyspnea, but has leg edema.  PULMONARY: Positive for cough, sputum, shortness of breath, and wheezing. No hemoptysis.  GASTROINTESTINAL: No abdominal pain, nausea, vomiting, or diarrhea. No melena or bloody stool.  GENITOURINARY: No dysuria, hematuria, or incontinence.  SKIN: No rash or jaundice.  NEUROLOGIC: No syncope, loss of consciousness, or seizure.  ENDOCRINOLOGY: No polyuria, polydipsia, heat or cold intolerance.  HEMATOLOGY: No easy bruising or bleeding.   PHYSICAL EXAMINATION: VITAL SIGNS: Temperature 98.5, blood pressure 105/53, pulse 87, oxygen saturation 92% on oxygen 4 liters.  GENERAL: The patient is alert, awake, oriented, in no acute distress.  HEENT: Pupils round, equal, reactive to light and accommodation. Moist oral mucosa. Clear oropharynx. NECK: Supple, no JVD or carotid bruits. No lymphadenopathy. No thyromegaly.  CARDIOVASCULAR: S1 and S2, regular rate and rhythm. No murmurs or gallops. PULMONARY: Bilateral very limited air entry, very weak breath sounds with expiratory wheezing and some rhonchi. No rales. No use of accessory muscle to breathe.  ABDOMEN: Soft. No distention. No tenderness. Obese. No oral organomegaly. Bowel sounds present.  EXTREMITIES: Bilateral lower extremity edema of 1+, chronic skin changes. No clubbing or cyanosis. Bilateral pedal pulses present.  SKIN: No rash or jaundice.  NEUROLOGY: A and O x 3. No focal deficit. Power 5/5. Sensation intact.   LABORATORY DATA: Glucose 188, BUN 19, creatinine 1.0. Electrolytes normal. Troponin less than 0.02. WBC 11.3, hemoglobin 12.2, platelets 134.   Chest x-ray: :Slight bilateral pulmonary edema, superimposed on severe emphysema. EKG showed atrial fibrillation at 95 BPM.  IMPRESSIONS: 1. Acute on chronic respiratory failure.  2. Chronic obstructive pulmonary disease exacerbation.  3. Atrial fibrillation. 4. History of gastrointestinal bleeding.  5. History of cerebral vascular  accident.   PLAN OF TREATMENT: 1. The patient will be admitted to medical floor. We will start IV Solu-Medrol, gave her Xopenex and also continue the patient's home nebulizer medications, Symbicort and Spiriva. We will get a pulmonary consult from Dr. Meredeth IdeFleming.  2. For atrial fibrillation, since the patient had GI bleeding history last December, the patient's aspirin was discontinued. According to the patient's husband, the patient is taking aspirin 81 mg p.o. daily, and we will continue aspirin and statin.  3. Anemia is stable.  4. Acute on chronic respiratory failure, continue nebulizer treatment and oxygen by nasal cannula.  5. I discussed the patient's condition and plan of treatment with the patient and the patient's husband.   CODE STATUS: The patient wants full code.   TIME SPENT: About 56 minutes    ____________________________ Shaune PollackQing Welda Azzarello, MD qc:mw D: 06/01/2014 17:02:56 ET T: 06/01/2014 17:42:23 ET JOB#: [id number]  cc: Shaune PollackQing Cintia Gleed, MD, <Dictator> Shaune PollackQING Mitchael Luckey MD ELECTRONICALLY SIGNED 06/03/2014 17:34

## 2014-09-02 ENCOUNTER — Encounter: Payer: Self-pay | Admitting: Internal Medicine

## 2014-09-15 ENCOUNTER — Encounter: Payer: Self-pay | Admitting: Internal Medicine

## 2014-09-15 ENCOUNTER — Ambulatory Visit (INDEPENDENT_AMBULATORY_CARE_PROVIDER_SITE_OTHER): Payer: Medicare Other | Admitting: Internal Medicine

## 2014-09-15 VITALS — BP 82/68 | HR 78 | Temp 97.4°F | Ht 60.0 in | Wt 158.0 lb

## 2014-09-15 DIAGNOSIS — I340 Nonrheumatic mitral (valve) insufficiency: Secondary | ICD-10-CM

## 2014-09-15 DIAGNOSIS — J449 Chronic obstructive pulmonary disease, unspecified: Secondary | ICD-10-CM

## 2014-09-15 DIAGNOSIS — G4733 Obstructive sleep apnea (adult) (pediatric): Secondary | ICD-10-CM

## 2014-09-15 DIAGNOSIS — J9611 Chronic respiratory failure with hypoxia: Secondary | ICD-10-CM

## 2014-09-15 DIAGNOSIS — I272 Pulmonary hypertension, unspecified: Secondary | ICD-10-CM

## 2014-09-15 DIAGNOSIS — J9610 Chronic respiratory failure, unspecified whether with hypoxia or hypercapnia: Secondary | ICD-10-CM | POA: Insufficient documentation

## 2014-09-15 DIAGNOSIS — I270 Primary pulmonary hypertension: Secondary | ICD-10-CM

## 2014-09-15 DIAGNOSIS — IMO0001 Reserved for inherently not codable concepts without codable children: Secondary | ICD-10-CM

## 2014-09-15 DIAGNOSIS — Z9989 Dependence on other enabling machines and devices: Principal | ICD-10-CM

## 2014-09-15 MED ORDER — ALBUTEROL SULFATE (5 MG/ML) 0.5% IN NEBU: 0.5000 mL | INHALATION_SOLUTION | RESPIRATORY_TRACT | Status: AC | PRN

## 2014-09-15 MED ORDER — ALBUTEROL SULFATE HFA 108 (90 BASE) MCG/ACT IN AERS: 2.0000 | INHALATION_SPRAY | Freq: Four times a day (QID) | RESPIRATORY_TRACT | Status: AC | PRN

## 2014-09-15 NOTE — Progress Notes (Signed)
Date: 09/15/2014  MRN# [id number] Alisha Collins 11/09/1940  Referring Physician: Dr. Gerrit HeckFath  Alisha Collins is a 74 y.o. old female seen in consultation for COPD  CC:  Chief Complaint  Patient presents with  . Advice Only    [facility] 4Pt referred by Dr. Lady GaryFath Chronic Respiratory failure/COPD. Old Pulmonologist [facility] and Dr. Meredeth IdeFleming.    HPI:  Patient is a 74 year old female seen in consultation today for COPD optimization and chronic respiratory failure. She was referred by Dr. Lady GaryFath. Review of the medical record as stated below. Patient is a past medical history of COPD, sleep apnea, hypothyroidism, hypertension, cardiac myopathy, anemia, hyperlipidemia, CVA, multiple gastric ulcers,. Her cardiomyopathy is moderate to severe with mitral regurgitation as well as status post atrial septal defect closure. Patient also has a history of chronic sinusitis. In addition to her other cardiac history she also has a history of atrial fibrillation is not a candidate for anticoagulation secondary to history of rectal bleeding. Patient is a former smoker she quit in 1990, previously smoked 2 packs per day for 25 years. Given her level of COPD she was [facility], and by Dr. Mayo AoFlemming, she is currently on supplemental oxygen 3 L continuously along with albuterol rescue nebulizer and inhaler, she is advised to use the albuterol nebulizer every 4 hours scheduled, fluticasone, furosemide, Symbicort 160/4.52 inhalations twice a day, Spiriva capsule daily. She has not had any recurrent exacerbations of his COPD since February 2016. She states that overall she is doing well today, she is using 3 L of oxygen continuously, further history reveals that she has not used her CPAP machine and over 10 years. She endorses dyspnea on exertion, dyspnea at rest at times, easily fatigued.   Review of records by Dr. Carole BinningMungal  [facility] PULMONARY VISIT - 02/2013 Subspecialty Comments: 06/2011 :Six-minute walk  915 feet, 3 liters 90%.   SUBJECTIVE: No real change in pulmonary symptoms, though is not getting out and about. Husband feels that this is mood related. No AO, hemopt. Has wheeze and orthopnea which are unchanged. No change in LE edema, but this is persistent  HISTORY OF PRESENT ILLNESS: The patient has been markedly improved in terms of her exercise performance and lower extremity edema, status post repair of ASD. She has not gained weight. The patient states over the last 6 weeks or so, she has been profoundly fatigued, depressed and prone to bursting into tears. She notes that she is thinking about death and she has not planned her funeral, etc. The patient denies suicidal ideation. After some discussion with the patient and her husband, they have agreed to a psychiatry referral. The patient states that she was admitted to [facility] a week or two ago for near loss of consciousness with negative workup. The patient continues to employ her Spiriva and Symbicort, but requires no rescue. The patient states that while she can walk, she just feels too depressed and fatigued to do walk and actually declines a 6-minute walk today.   REVIEW OF SYSTEMS: Otherwise x10 and negative except as noted.   History   Social History  . Marital Status: Married  Spouse Name: N/A  Number of Children: N/A  . Years of Education: N/A   Occupational History  . Not on file.   Social History Main Topics  . Smoking status: Former Smoker  Types: Cigarettes  . Smokeless tobacco: Not on file  . Alcohol Use: Not on file  . Drug Use:  Not on file  . Sexual Activity: Not on file   Other Topics Concern  . Not on file   Social History Narrative  . No narrative on file   No family history on file.  PHYSICAL EXAMINATION:   GENERAL: Well-developed, well-nourished white female wearing nasal cannula.   VITAL SIGNS: BP 119/79  Pulse 71  Temp(Src) 36.3 C (97.3 F) (Oral)  Resp 16  Ht 151.5 cm (4'  11.65")  Wt 74.8 kg (164 lb 14.5 oz)  BMI 32.59 kg/m2  SpO2 76% RA.   HEENT: There is erythema on the right nasal passage with some evidence of hemorrhage..   CHEST: There are bibasilar crackles on inspiration and no end expiratory wheeze, which is faint.   CARDIOVASCULAR: Regular rate and rhythm.   EXTREMITIES: Without clubbing, cyanosis or edema. The patient is wearing support hose. PSYCH: Reveals profoundly depressed individual.   INVESTIGATION: Spirometry last visit: FVC post , is 81.67 (72), FEV1, 48% post. FEF25-75 16%, consistent with a moderate obstructive ventilatory impairment with significant increase in the mid flows after albuterol. Worse compared to prior testing. DLCO previously 62% of predicted consistent with a mild decrease and corrects to normal for alveolar volume at 82%.   ASSESSMENT: The patient is doing well from a pulmonary standpoint, with no decrease in her pulmonary function. In fact, her pulmonary function has improved since her ASD repair. I cannot comment on her exercise tolerance since she has declined to do the 6-minute walk today. After some discussion, the patient has agreed to referral to psychiatry to evaluate and manage her depression.   PLAN:  1. Continue medications.  2. Will follow with psychiatry in several weeks.  3. Patient will discuss with cards if sotolol could contribute to wheeze and fatigue. 4. The patient is to return to clinic in 3 months' time or sooner on a p.r.n. basis.  5. Repeat overnight oximetry. 6. She is due to have a repeat ECHO and is following with cardiology locally.    [facility] Date of Service: 08/18/2014 Date of Birth: [birth year] PCP: DAVID Ezra Sites, MD  History of Present Illness: Ms. Alisha Collins is a 74 y.o.female patient who returns for follow-up visit. She is noted worsening shortness of breath and worsening peripheral edema. Her atrial fibrillation is being treated with Betapace. She is not currently  anticoagulated due to bleeding rest. Her shortness of breath is becoming more over problem in she would like referral to a pulmonologist for further evaluation. She has significant mitral valve disease as well as a history of pulmonary hypertension. She is status post AST defect closure at [facility]. Her peripheral edema is at its baseline and process. She complains of weakness and fatigue. 74 y.o. female with  ICD-10-CM ICD-9-CM  1. Cardiomyopathy patient with a cardiomyopathy. Has pulmonary hypertension and peripheral edema. She complains of worsening shortness of breath an worsening peripheral edema . She was resumed on her furosemide but still has fairly significant peripheral edema. She will need to keep her feet elevated and continued to use oxygen 24 hours a day. I42.9 425.4  2. Mixed hyperlipidemia-stable E78.2 272.2  3. Essential hypertension-blood pressure relatively well-controlled. Will follow pressures well on furosemide and follow for symptoms I10 401.9  4. Sleep apnea, unspecified type-use CPAP G47.30 780.57  5. Worsening pulmonary symptoms. Does have pulmonary hypertension. At her request will refer to pulmonology for further evaluation and recommendations regarding further treatment.   PMHX:   Past Medical History  Diagnosis Date  .  COPD (chronic obstructive pulmonary disease)   . GI bleed   . Atrial fibrillation   . CVA (cerebral infarction)   . Chronic anemia   . Chronic respiratory failure    Surgical Hx:  Past Surgical History  Procedure Laterality Date  . Patent foramen ovale closure  2012   Family Hx:  Family History  Problem Relation Age of Onset  . Stroke Father   . Breast cancer Sister   . Sleep apnea Brother    Social Hx:   History  Substance Use Topics  . Smoking status: Former Smoker -- 1.00 packs/day for 25 years    Types: Cigarettes  . Smokeless tobacco: Never Used  . Alcohol Use: No   Medication:   Current Outpatient Rx  Name  Route  Sig   Dispense  Refill  . albuterol (PROVENTIL) (5 MG/ML) 0.5% nebulizer solution   Inhalation   Inhale 0.5 mLs into the lungs every 4 (four) hours as needed.         . budesonide-formoterol (SYMBICORT) 160-4.5 MCG/ACT inhaler   Inhalation   Inhale 2 puffs into the lungs 2 (two) times daily.         . Cetirizine HCl 10 MG CAPS   Oral   Take 10 mg by mouth daily.         . citalopram (CELEXA) 20 MG tablet   Oral   Take 20 mg by mouth daily.         . ferrous sulfate 325 (65 FE) MG EC tablet   Oral   Take 325 mg by mouth every morning. On hold per pt.         . fluticasone (FLONASE) 50 MCG/ACT nasal spray   Nasal   Place 50 g into the nose daily.         . furosemide (LASIX) 40 MG tablet   Oral   Take 40 mg by mouth 2 (two) times daily.         Marland Kitchen. levothyroxine (SYNTHROID, LEVOTHROID) 100 MCG tablet   Oral   Take 100 mcg by mouth daily.         . pantoprazole (PROTONIX) 40 MG tablet   Oral   Take 40 mg by mouth daily as needed.          . potassium chloride SA (K-DUR,KLOR-CON) 20 MEQ tablet   Oral   Take 20 mEq by mouth 2 (two) times daily.         . simvastatin (ZOCOR) 20 MG tablet   Oral   Take 20 mg by mouth at bedtime.         . sotalol (BETAPACE) 120 MG tablet   Oral   Take 120 mg by mouth 2 (two) times daily.         Marland Kitchen. tiotropium (SPIRIVA) 18 MCG inhalation capsule   Inhalation   Place 18 mcg into inhaler and inhale daily.             Allergies:  Warfarin sodium  Review of Systems: Gen:  Denies  fever, sweats, chills HEENT: Denies blurred vision, double vision, ear pain, eye pain, hearing loss, nose bleeds, sore throat Cvc:  No dizziness, chest pain or heaviness Resp:   Shortness of breath with exertion, intermittent shortness of breath at rest, Gi: Denies swallowing difficulty, stomach pain, nausea or vomiting, diarrhea, constipation, bowel incontinence Gu:  Denies bladder incontinence, burning urine Ext:   No Joint pain,  stiffness or swelling Skin: No skin rash, easy  bruising or bleeding or hives Endoc:  No polyuria, polydipsia , polyphagia or weight change Psych: No depression, insomnia or hallucinations  Other:  All other systems negative  Physical Examination:   VS: BP 82/68 mmHg  Pulse 78  Temp(Src) 97.4 F (36.3 C) (Oral)  Ht 5' (1.524 m)  Wt 158 lb (71.668 kg)  BMI 30.86 kg/m2  SpO2 92%  General Appearance: No distress  Neuro:without focal findings, mental status, speech normal, alert and oriented, cranial nerves 2-12 intact, reflexes normal and symmetric, sensation grossly normal  HEENT: PERRLA, EOM intact, no ptosis, no other lesions noticed;  Pulmonary: No use of accessory muscles, decreased breath sounds at the bilateral bases, no cough, no sputum production. CardiovascularNormal S1,S2.  No m/r/g.  Abdominal aorta pulsation normal.   l Abdomen: Benign, Soft, non-tender, No masses, hepatosplenomegaly, No lymphadenopathy Renal:  No costovertebral tenderness  GU:  No performed at this time. Endoc: No evident thyromegaly, no signs of acromegaly or Cushing features Skin:   warm, no rashes, no ecchymosis  Extremities: normal, no cyanosis, clubbing, 2+ bilateral lower extremity edema (pitting)   Rad results: (The following images and results were reviewed by Dr. Dema Severin). ECHO 2015 Summary:  1. Left ventricular ejection fraction, by visual estimation, is 50 to  55%.  2. Normal global left ventricular systolic function.  3. Severely dilated left atrium.  4. Severely dilated right atrium.  5. Rheumatic mitral valve.  6. Severe mitral valve regurgitation.  7. Moderate to severe tricuspid regurgitation.  8. Moderately elevated pulmonary artery systolic pressure.  9. Moderately increased left ventricular posterior wall thickness.  CTA CHEST 2014 Chest:  Superficial soft tissues of the chest wall unremarkable.  No axillary or supraclavicular adenopathy.  Central airways are  patent. Geographic ground-glass opacity throughout the lungs. No large confluent airspace disease.  Nodular opacities at the bilateral lung bases.  No interlobular septal thickening.  No pneumothorax or pleural effusion.  There are several mediastinal lymph nodes, none of which are enlarged by CT size criteria, with the largest short axis dimension measuring 9 mm- 10 mm of the lowest right peritracheal nodal station.  Global cardiomegaly, with pronounced enlargement of the right and left atria. Surgical changes of prior atrial septal defect/patent foramen ovale repair.  Extensive calcifications of the left main, left anterior descending, circumflex, right coronary arteries.  No significant pericardial fluid/thickening.  Vascular:  No evidence of aortic aneurysm. The lumen of the aorta is not well visualized, given the timing of the contrast bolus. Calcifications of the aortic arch and the branch vessels. No periaortic fluid.  The diameter of the ascending aorta measures approximately 3.1 cm. The diameter of the main pulmonary artery measures 3.4 cm. As the pulmonary arteries extend toward the periphery of the lungs, there is greater than expected diameter towards the distal aspect, with a reversal of the expected ratio of pulmonary arteries 2 there adjacent bronchi.  No filling defects to suggest emboli.  The contrast bolus extends be low the diaphragm into the hepatic veins and the hepatic IVC.  Upper abdomen:  Unremarkable appearance of liver and spleen. Unremarkable appearance of visualized right kidney.  Low-density cystic structure associated with the lateral cortex of the left kidney, with low Hounsfield units. This measures approximately 2.2 cm.  No visualized free intraperitoneal fluid or free air.  Small lymph nodes of the mesenteric.  Surgical changes of cholecystectomy.  Musculoskeletal: No displaced fracture. Multilevel degenerative changes of the  visualized cervical, thoracic, and lumbar spine. Accentuated thoracic kyphotic  curvature.  Review of the MIP images confirms the above findings.   IMPRESSION: Evidence of pulmonary hypertension, with enlarged main pulmonary artery, as well as enlarged lobar, segmental, and subsegmental bilateral pulmonary arteries. There is also reflux of the contrast bolus through the right heart into the hepatic IVC and hepatic veins, indicating elevated right heart pressures.  Surgical changes of prior atrial septal defect or PFO repair. A prior patent defect may be related to the patient's appearance of pulmonary hypertension, depending on the timing of the closure. Minimal nodular airspace opacities at the right greater than left lung bases. This may reflect atelectasis and/or scarring, or less likely developing infection.  Atherosclerosis including left main and 3 vessel coronary artery disease. Cardiomegaly  Study is negative for pulmonary emboli.  Incidentally imaged left kidney lesion, likely a cyst though incompletely characterized.    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 Mitral valve regurgitation Currently being followed by [facility] cardiology. Continue to follow with their recommendations and plans. Diuresis, control of hypertension, strict monitoring of fluid intake is paramount to her overall respiratory and cardiac status.     Pulmonary hypertension I believe the patient's pulmonary hypertension is mixed: Possible cor pulmonale/port arterial hypertension, left heart disease, right heart disease, valvular cardiac disease, obstructive sleep apnea, COPD.  At this time optimization of her COPD along with reducing pulmonary pressures via usage of her CPAP machine may prove to be  beneficial. Discussed above for the patient in great detail and explained that her dyspnea is multifactorial and compliance with CPAP and COPD regiment will add benefit to her overall quality of life and clinical status. Review of records did not reveal any right heart catheterization that would show pulmonary arterial pressures, however CT of the chest that showed a large pulmonary artery radiographically consistent with PAH. Well optimize COPD and OSA before discussing any further PAH interventions such as right heart cath, prostacyclins, vasodilated, and prostaglandins.  In either case, given her complex cardiac history, and deconditioning with advanced age I do not believe she will be the best candidate for optimal candidate for such medications or major invasive therapy.   OSA on CPAP I am, has not worn CPAP in over 10 years.  Plan: -Split night study.   Chronic respiratory failure Multifactorial, OSA, COPD, cardiac history, cardiac valvular disease, pulmonary hypertension.  Plan: -6 minute walk test prior to next visit, continue with the use of oxygen continuously supplemental.   COPD bronchitis Known history of COPD, plan as stated below.  - albuterol inhaler - 2puff every 3-4 hours as needed for shortness of breath\wheezing\recurrent cough - pulmonary function testing prior to follow up visit.  - Continue with Symbicort     Updated Medication List Outpatient Encounter Prescriptions as of 09/15/2014  Medication Sig  . albuterol (PROVENTIL) (5 MG/ML) 0.5% nebulizer solution Inhale 0.5 mLs into the lungs every 4 (four) hours as needed.  . budesonide-formoterol (SYMBICORT) 160-4.5 MCG/ACT inhaler Inhale 2 puffs into the lungs 2 (two) times daily.  . Cetirizine HCl 10 MG CAPS Take 10 mg by mouth daily.  . citalopram (CELEXA) 20 MG tablet Take 20 mg by mouth daily.  . ferrous sulfate 325 (65 FE) MG EC tablet Take 325 mg by mouth every morning. On hold per pt.  . fluticasone  (FLONASE) 50 MCG/ACT nasal spray Place 50 g into  the nose daily.  . furosemide (LASIX) 40 MG tablet Take 40 mg by mouth 2 (two) times daily.  Marland Kitchen levothyroxine (SYNTHROID, LEVOTHROID) 100 MCG tablet Take 100 mcg by mouth daily.  . pantoprazole (PROTONIX) 40 MG tablet Take 40 mg by mouth daily as needed.   . potassium chloride SA (K-DUR,KLOR-CON) 20 MEQ tablet Take 20 mEq by mouth 2 (two) times daily.  . simvastatin (ZOCOR) 20 MG tablet Take 20 mg by mouth at bedtime.  . sotalol (BETAPACE) 120 MG tablet Take 120 mg by mouth 2 (two) times daily.  Marland Kitchen tiotropium (SPIRIVA) 18 MCG inhalation capsule Place 18 mcg into inhaler and inhale daily.  . [DISCONTINUED] aspirin EC 81 MG tablet Take 81 mg by mouth daily.  . [DISCONTINUED] Cholecalciferol (VITAMIN D3) 1000 UNITS CAPS Take 100 Units by mouth daily.   No facility-administered encounter medications on file as of 09/15/2014.    Orders for this visit: No orders of the defined types were placed in this encounter.     Thank  you for the consultation and for allowing [facility] to assist in the care of your patient. Our recommendations are noted above.  Please contact us if we can be of further service.   Stephanie Acre, MD [facility] Office Number: [phone]

## 2014-09-15 NOTE — Patient Instructions (Addendum)
Follow up Dr. Dema SeverinMungal in 2 months - we will order a split night study  - albuterol inhaler - 2puff every 3-4 hours as needed for shortness of breath\wheezing\recurrent cough - pulmonary function testing prior to follow up visit.  - you have mixed pulmonary hypertension - COPD, OSA, Left Heart disease - Continue with Symbicort

## 2014-09-29 NOTE — Assessment & Plan Note (Signed)
[facility]. Continue to follow with their recommendations and plans. Diuresis, control of hypertension, strict monitoring of fluid intake is paramount to her overall respiratory and cardiac status.

## 2014-09-29 NOTE — Assessment & Plan Note (Signed)
Multifactorial, OSA, COPD, cardiac history, cardiac valvular disease, pulmonary hypertension.  Plan: -6 minute walk test prior to next visit, continue with the use of oxygen continuously supplemental.

## 2014-09-29 NOTE — Assessment & Plan Note (Signed)
I believe the patient's pulmonary hypertension is mixed: Possible cor pulmonale/port arterial hypertension, left heart disease, right heart disease, valvular cardiac disease, obstructive sleep apnea, COPD.  At this time optimization of her COPD along with reducing pulmonary pressures via usage of her CPAP machine may prove to be beneficial. Discussed above for the patient in great detail and explained that her dyspnea is multifactorial and compliance with CPAP and COPD regiment will add benefit to her overall quality of life and clinical status. Review of records did not reveal any right heart catheterization that would show pulmonary arterial pressures, however CT of the chest that showed a large pulmonary artery radiographically consistent with PAH. Well optimize COPD and OSA before discussing any further PAH interventions such as right heart cath, prostacyclins, vasodilated, and prostaglandins.  In either case, given her complex cardiac history, and deconditioning with advanced age I do not believe she will be the best candidate for optimal candidate for such medications or major invasive therapy.

## 2014-09-29 NOTE — Assessment & Plan Note (Signed)
Known history of COPD, plan as stated below.  - albuterol inhaler - 2puff every 3-4 hours as needed for shortness of breath\wheezing\recurrent cough - pulmonary function testing prior to follow up visit.  - Continue with Symbicort

## 2014-09-29 NOTE — Assessment & Plan Note (Signed)
I am, has not worn CPAP in over 10 years.  Plan: -Split night study.

## 2014-12-30 DEATH — deceased

## 2015-09-27 IMAGING — CT CT ANGIO CHEST
1 series · 17 of 34 positions shown · IV contrast (APPLIED)
Comparison: Chest x-ray 06/01/2014, 09/29/2011

CLINICAL DATA: 74-year-old female with a history of shortness of
breath. Cough and congestion for 2 days.

EXAM:
CT ANGIOGRAPHY CHEST WITH CONTRAST
TECHNIQUE: Multidetector CT imaging of the chest was performed using the
standard protocol during bolus administration of intravenous
contrast. Multiplanar CT image reconstructions and MIPs were
obtained to evaluate the vascular anatomy.
CONTRAST:  IV contrast bolus

[Series 4: pe 1.0 thins · axial · 0.68mm/px · z∈[-1152,-862]mm · 17 of 313 slices shown]
[im 12/313  lung]
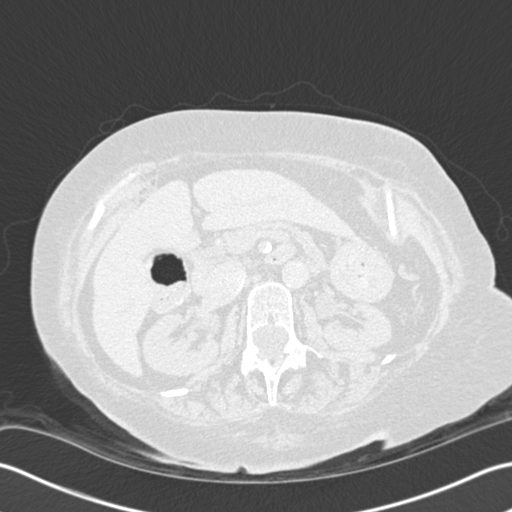
[im 35/313  mediastinal]
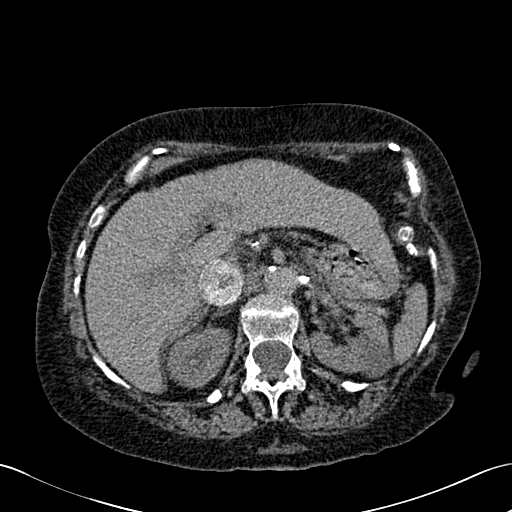
[im 58/313  lung]
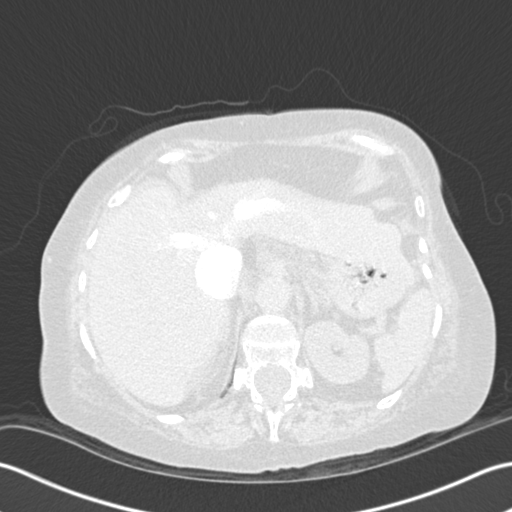
[im 70/313  mediastinal]
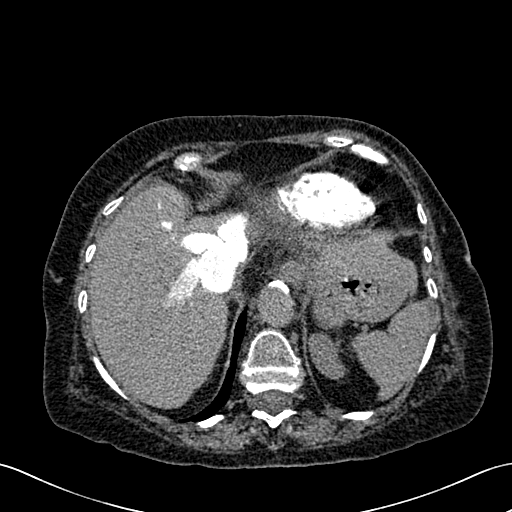
[im 93/313  lung]
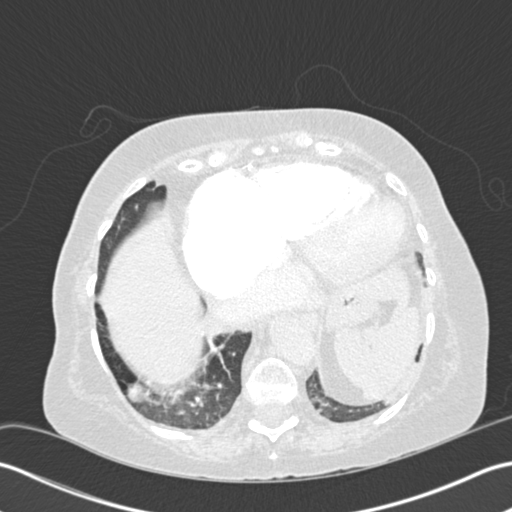
[im 116/313  mediastinal]
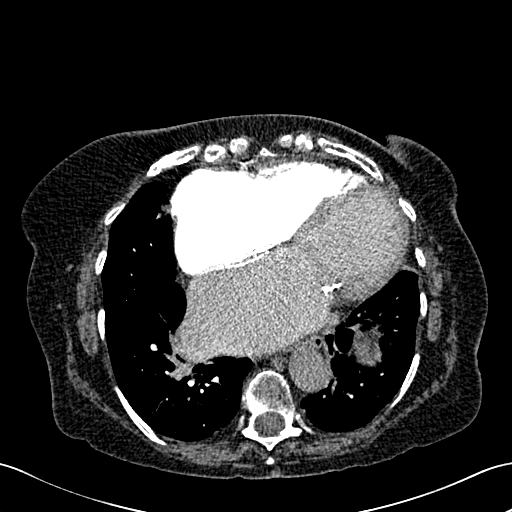
[im 128/313  lung]
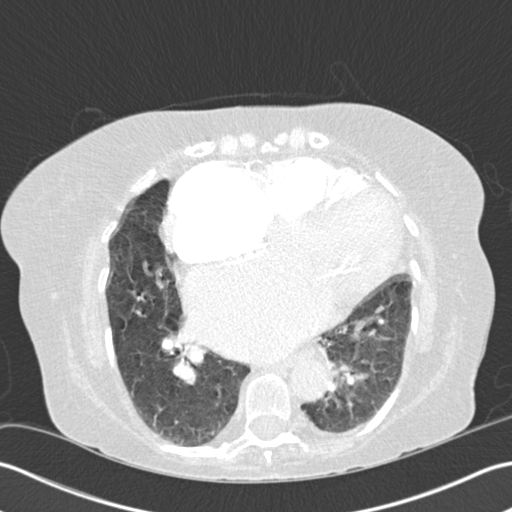
[im 147/313  mediastinal]
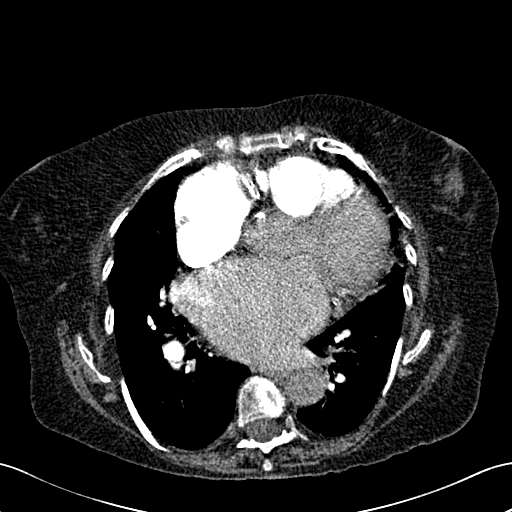
[im 162/313  lung]
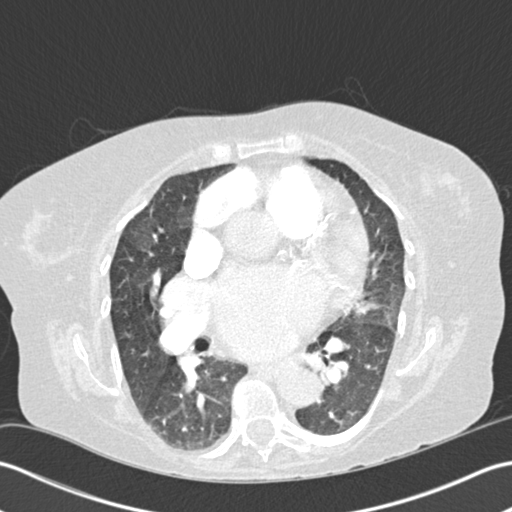
[im 167/313  mediastinal]
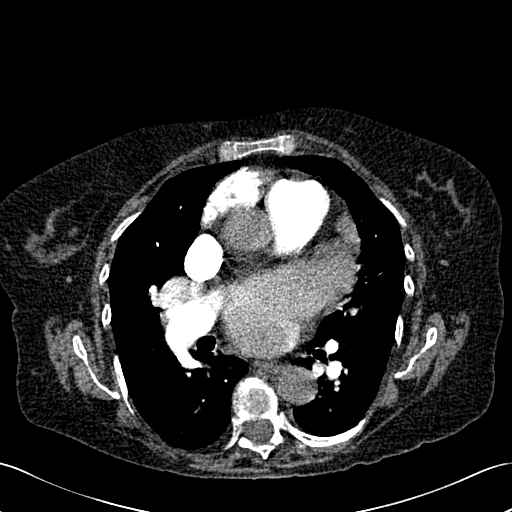
[im 185/313  lung]
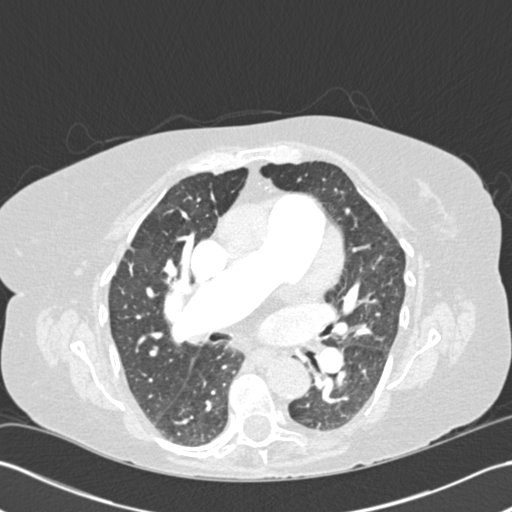
[im 197/313  mediastinal]
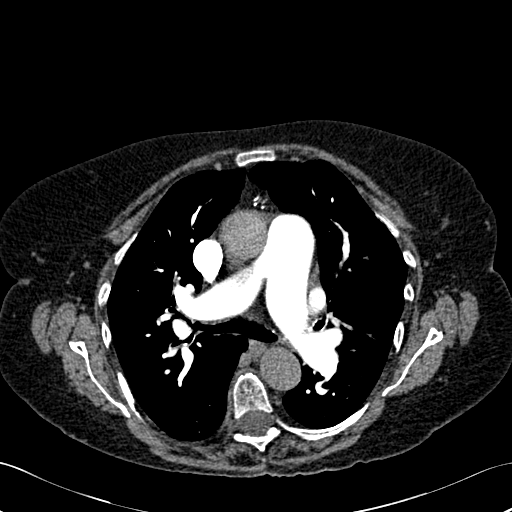
[im 220/313  lung]
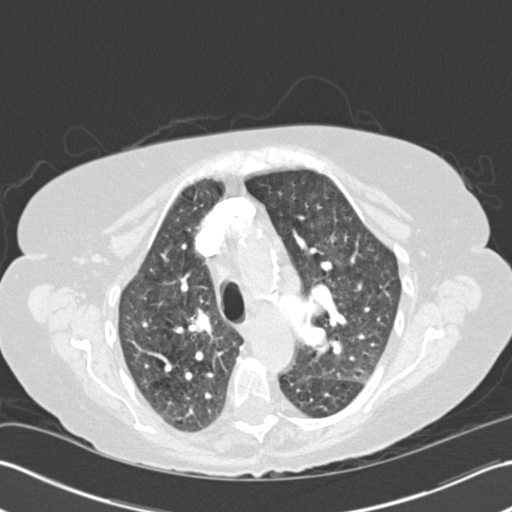
[im 243/313  mediastinal]
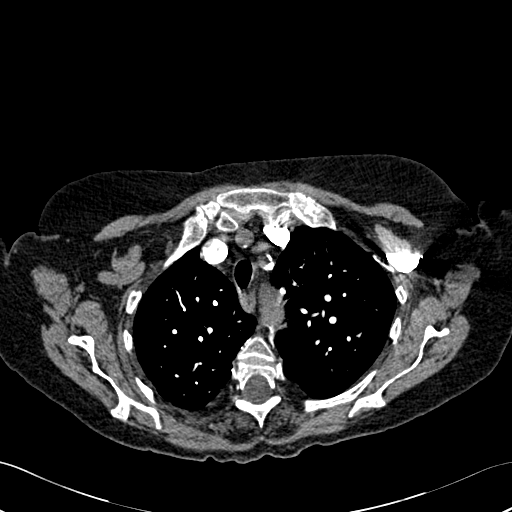
[im 255/313  lung]
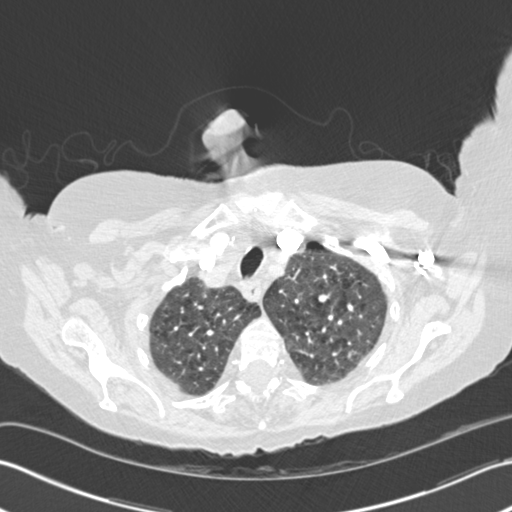
[im 278/313  mediastinal]
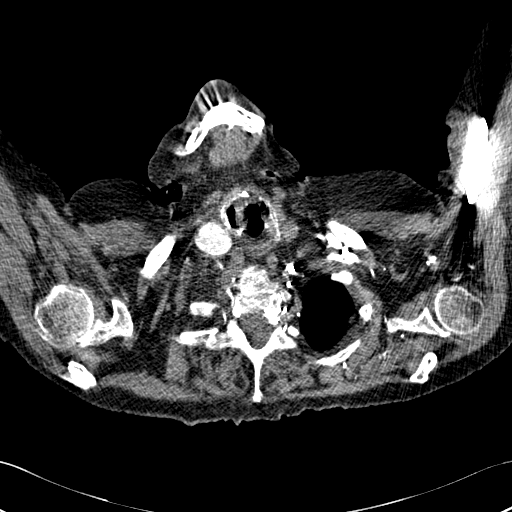
[im 301/313  lung]
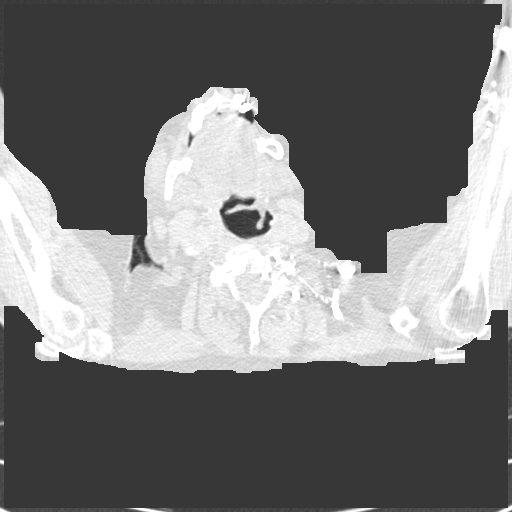

[17 of 34 positions shown; findings below may reference images not displayed]

FINDINGS: Chest:

Superficial soft tissues of the chest wall unremarkable.

No axillary or supraclavicular adenopathy.

Central airways are patent.

Geographic ground-glass opacity throughout the lungs. No large
confluent airspace disease.

Nodular opacities at the bilateral lung bases.

No interlobular septal thickening.

No pneumothorax or pleural effusion.

There are several mediastinal lymph nodes, none of which are
enlarged by CT size criteria, with the largest short axis dimension
measuring 9 mm- 10 mm of the lowest right peritracheal nodal
station.

Global cardiomegaly, with pronounced enlargement of the right and
left atria. Surgical changes of prior atrial septal defect/patent
foramen ovale repair.

Extensive calcifications of the left main, left anterior descending,
circumflex, right coronary arteries.

No significant pericardial fluid/thickening.

Vascular:

No evidence of aortic aneurysm. The lumen of the aorta is not well
visualized, given the timing of the contrast bolus. Calcifications
of the aortic arch and the branch vessels. No periaortic fluid.

The diameter of the ascending aorta measures approximately 3.1 cm.
The diameter of the main pulmonary artery measures 3.4 cm. As the
pulmonary arteries extend toward the periphery of the lungs, there
is greater than expected diameter towards the distal aspect, with a
reversal of the expected ratio of pulmonary arteries 2 there
adjacent bronchi.

No filling defects to suggest emboli.

The contrast bolus extends be low the diaphragm into the hepatic
veins and the hepatic IVC.

Upper abdomen:

Unremarkable appearance of liver and spleen.

Unremarkable appearance of visualized right kidney.

Low-density cystic structure associated with the lateral cortex of
the left kidney, with low Hounsfield units. This measures
approximately 2.2 cm.

No visualized free intraperitoneal fluid or free air.

Small lymph nodes of the mesenteric.

Surgical changes of cholecystectomy.

Musculoskeletal: No displaced fracture. Multilevel degenerative
changes of the visualized cervical, thoracic, and lumbar spine.

Accentuated thoracic kyphotic curvature.

Review of the MIP images confirms the above findings.
IMPRESSION: Evidence of pulmonary hypertension, with enlarged main pulmonary
artery, as well as enlarged lobar, segmental, and subsegmental
bilateral pulmonary arteries. There is also reflux of the contrast
bolus through the right heart into the hepatic IVC and hepatic
veins, indicating elevated right heart pressures.

Surgical changes of prior atrial septal defect or PFO repair. A
prior patent defect may be related to the patient's appearance of
pulmonary hypertension, depending on the timing of the closure.

Minimal nodular airspace opacities at the right greater than left
lung bases. This may reflect atelectasis and/or scarring, or less
likely developing infection.

Atherosclerosis including left main and 3 vessel coronary artery
disease. Cardiomegaly

Study is negative for pulmonary emboli.

Incidentally imaged left kidney lesion, likely a cyst though
incompletely characterized.
